# Patient Record
Sex: Male | Born: 1945 | Race: White | Hispanic: No | Marital: Married | Smoking: Never smoker
Health system: Southern US, Community
[De-identification: ages and names within clinical notes are randomized; demographics above are authoritative.]

## PROBLEM LIST (undated history)

## (undated) DIAGNOSIS — N400 Enlarged prostate without lower urinary tract symptoms: Secondary | ICD-10-CM

## (undated) DIAGNOSIS — K219 Gastro-esophageal reflux disease without esophagitis: Secondary | ICD-10-CM

## (undated) DIAGNOSIS — K760 Fatty (change of) liver, not elsewhere classified: Secondary | ICD-10-CM

## (undated) DIAGNOSIS — F259 Schizoaffective disorder, unspecified: Secondary | ICD-10-CM

## (undated) DIAGNOSIS — R7303 Prediabetes: Secondary | ICD-10-CM

## (undated) DIAGNOSIS — K5909 Other constipation: Secondary | ICD-10-CM

## (undated) DIAGNOSIS — E785 Hyperlipidemia, unspecified: Secondary | ICD-10-CM

## (undated) DIAGNOSIS — F419 Anxiety disorder, unspecified: Secondary | ICD-10-CM

## (undated) HISTORY — DX: Hyperlipidemia, unspecified: E78.5

## (undated) HISTORY — DX: Benign prostatic hyperplasia without lower urinary tract symptoms: N40.0

## (undated) HISTORY — DX: Gastro-esophageal reflux disease without esophagitis: K21.9

## (undated) HISTORY — DX: Schizoaffective disorder, unspecified: F25.9

## (undated) HISTORY — PX: OTHER SURGICAL HISTORY: SHX169

## (undated) HISTORY — DX: Fatty (change of) liver, not elsewhere classified: K76.0

## (undated) HISTORY — DX: Prediabetes: R73.03

## (undated) HISTORY — DX: Other constipation: K59.09

## (undated) HISTORY — PX: COLONOSCOPY: SHX174

---

## 2006-11-02 ENCOUNTER — Ambulatory Visit: Payer: Self-pay | Admitting: Gastroenterology

## 2008-07-14 ENCOUNTER — Emergency Department: Payer: Self-pay | Admitting: Emergency Medicine

## 2015-05-01 ENCOUNTER — Other Ambulatory Visit: Payer: Self-pay | Admitting: Primary Care

## 2015-05-01 DIAGNOSIS — I872 Venous insufficiency (chronic) (peripheral): Secondary | ICD-10-CM

## 2015-05-03 ENCOUNTER — Ambulatory Visit
Admission: RE | Admit: 2015-05-03 | Discharge: 2015-05-03 | Disposition: A | Discharge status: Home / Self Care | Payer: Medicare Other | Source: Ambulatory Visit | Attending: Primary Care | Admitting: Primary Care

## 2015-05-03 DIAGNOSIS — I872 Venous insufficiency (chronic) (peripheral): Secondary | ICD-10-CM | POA: Diagnosis present

## 2016-09-19 IMAGING — US US EXTREM LOW VENOUS BILAT
1 series · 13 of 24 positions shown · non-contrast
Comparison: None.

CLINICAL DATA: Chronic pain with venous insufficiency

EXAM:
BILATERAL LOWER EXTREMITY VENOUS DUPLEX ULTRASOUND
TECHNIQUE: Gray-scale sonography with graded compression, as well as color
Doppler and duplex ultrasound were performed to evaluate the lower
extremity deep venous systems from the level of the common femoral
vein and including the common femoral, femoral, profunda femoral,
popliteal and calf veins including the posterior tibial, peroneal
and gastrocnemius veins when visible. The superficial great
saphenous vein was also interrogated. Spectral Doppler was utilized
to evaluate flow at rest and with distal augmentation maneuvers in
the common femoral, femoral and popliteal veins.

[Series 1: us extrem low venous bilat · 0.08mm/px · 13 of 58 slices shown]
[im 1/58]
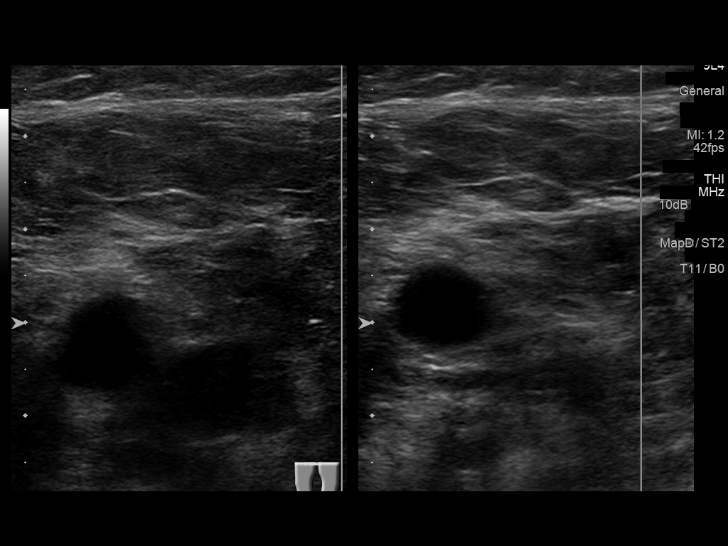
[im 5/58]
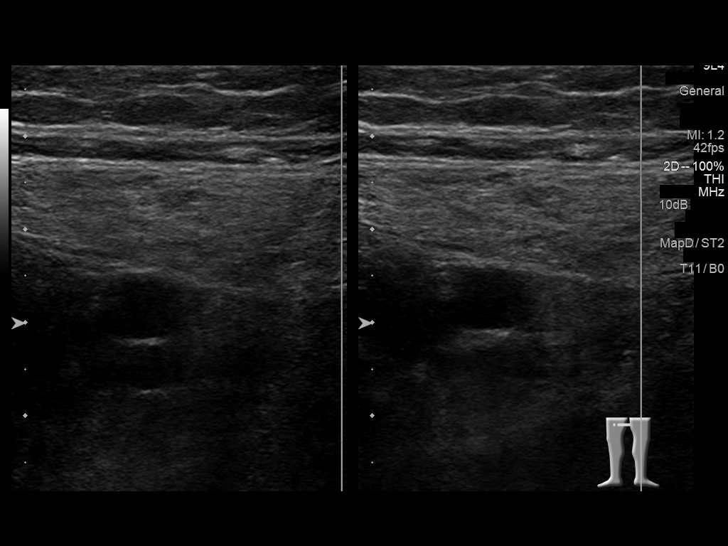
[im 10/58]
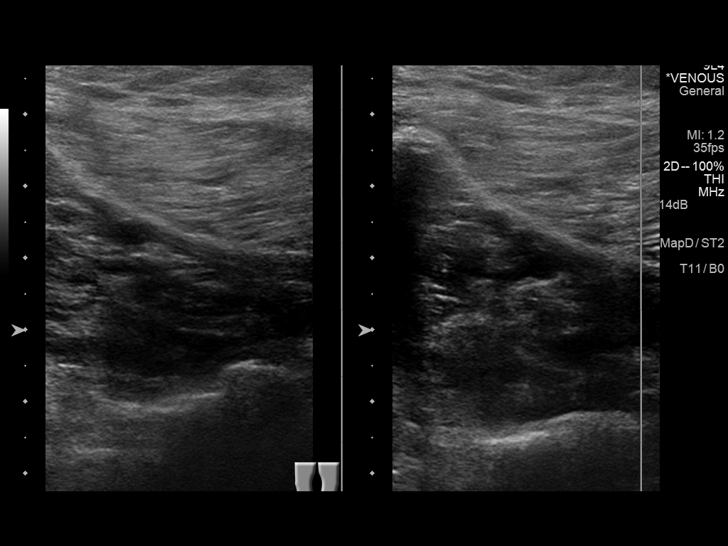
[im 15/58]
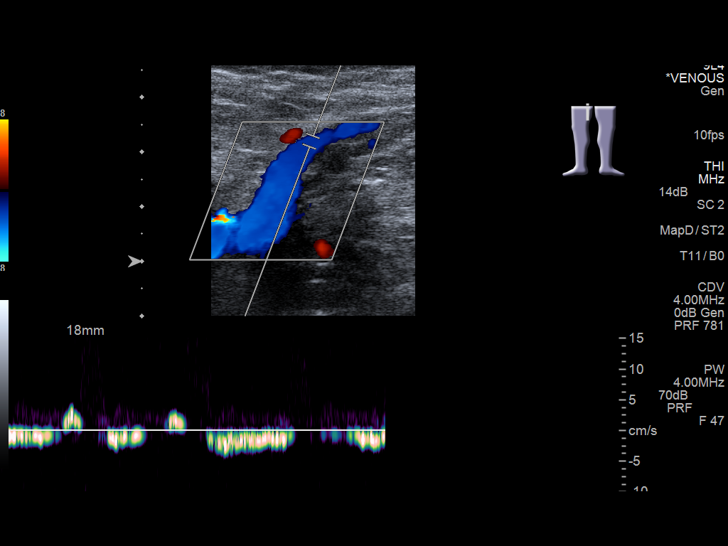
[im 20/58]
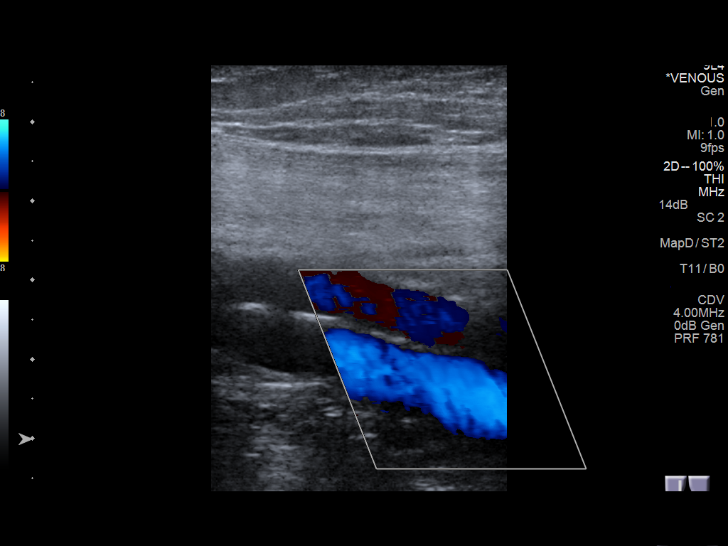
[im 25/58]
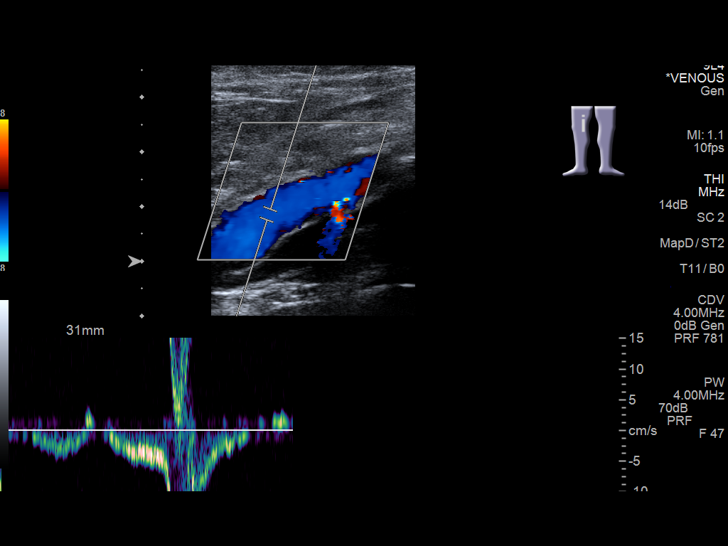
[im 30/58]
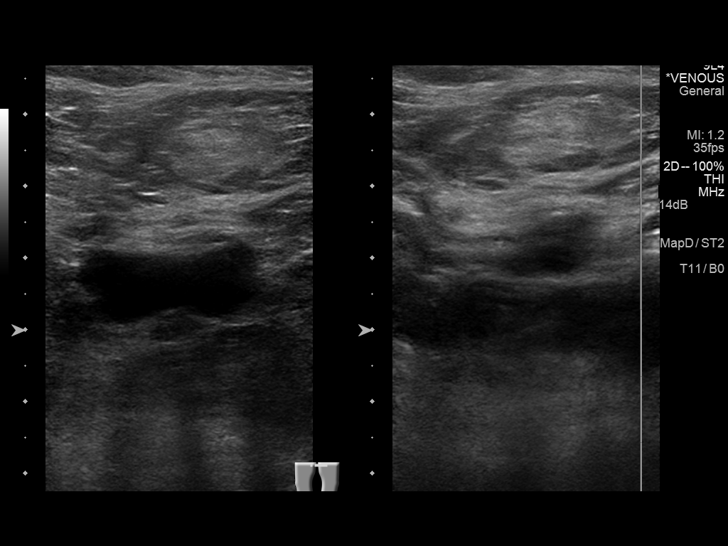
[im 33/58]
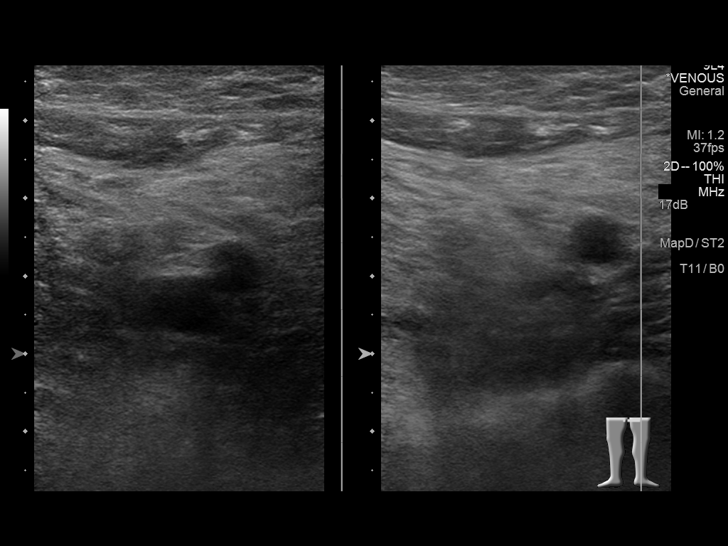
[im 38/58]
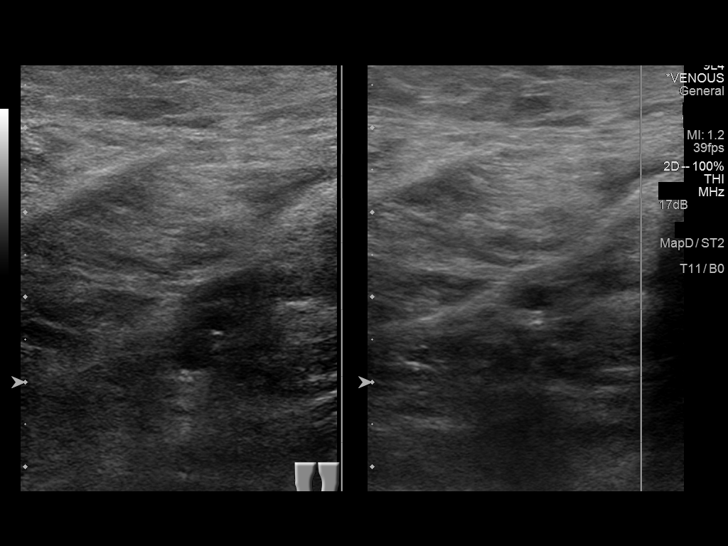
[im 43/58]
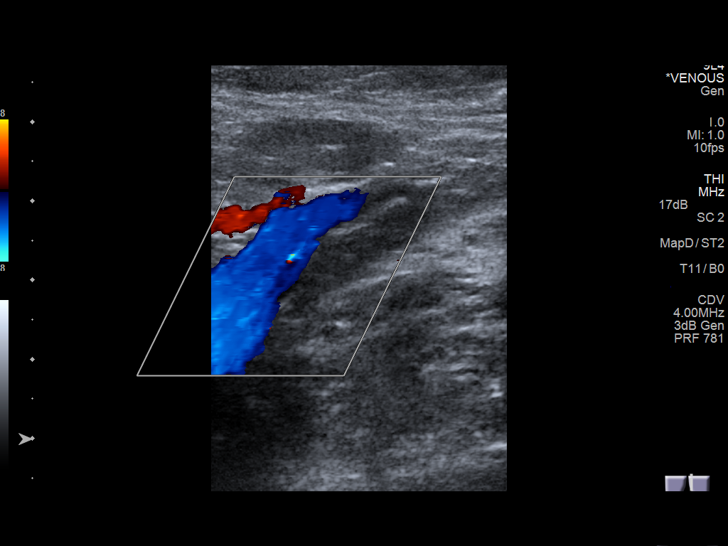
[im 48/58]
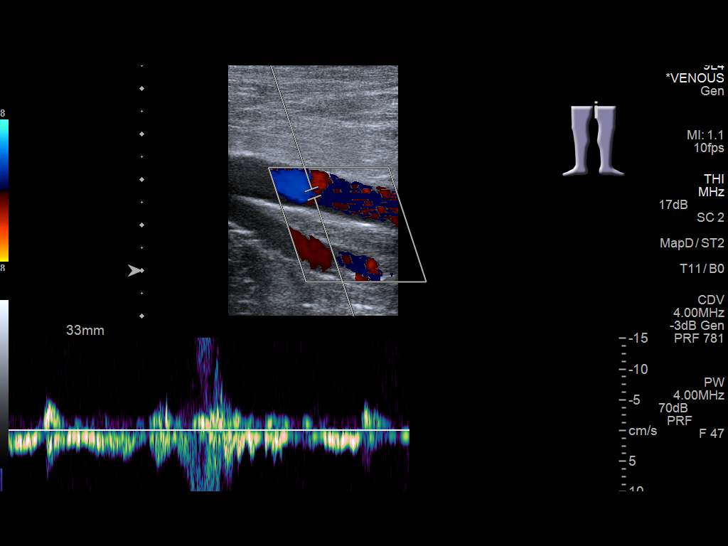
[im 53/58]
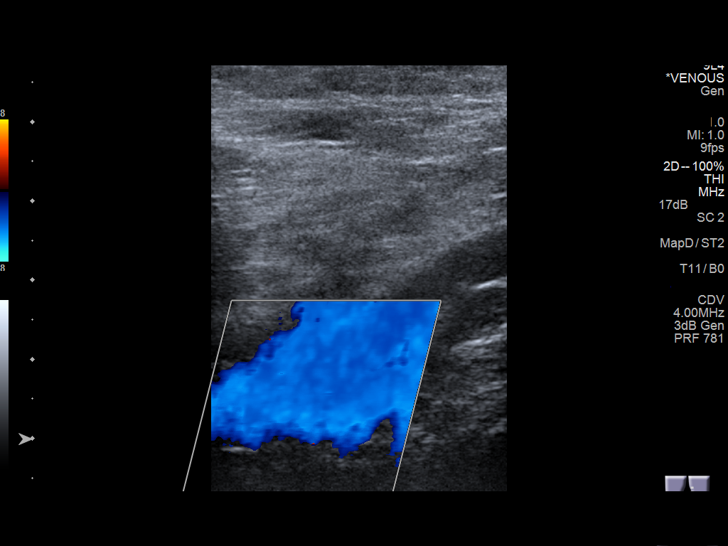
[im 58/58]
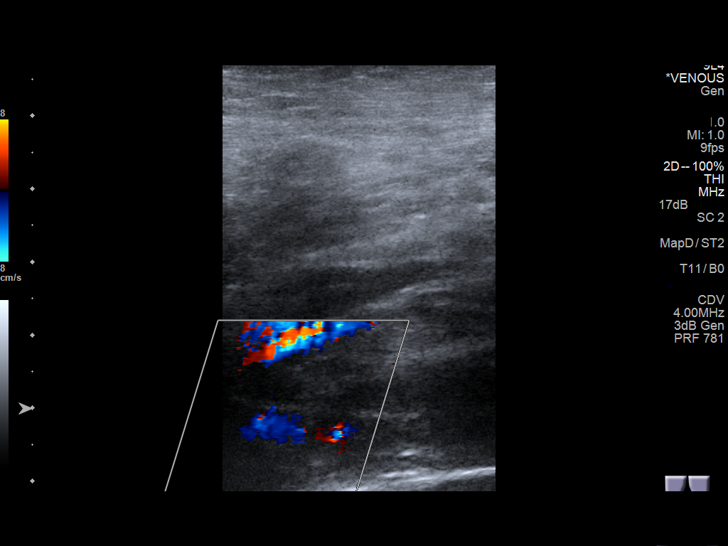

[13 of 24 positions shown; findings below may reference images not displayed]

FINDINGS: RIGHT LOWER EXTREMITY

Common Femoral Vein: No evidence of thrombus. Normal
compressibility, respiratory phasicity and response to augmentation.

Saphenofemoral Junction: No evidence of thrombus. Normal
compressibility and flow on color Doppler imaging.

Profunda Femoral Vein: No evidence of thrombus. Normal
compressibility and flow on color Doppler imaging.

Femoral Vein: No evidence of thrombus. Normal compressibility,
respiratory phasicity and response to augmentation.

Popliteal Vein: No evidence of thrombus. Normal compressibility,
respiratory phasicity and response to augmentation.

Calf Veins: No evidence of thrombus. Normal compressibility and flow
on color Doppler imaging.

Superficial Great Saphenous Vein: No evidence of thrombus. Normal
compressibility and flow on color Doppler imaging.

Venous Reflux:  None.

Other Findings:  None.

LEFT LOWER EXTREMITY

Common Femoral Vein: No evidence of thrombus. Normal
compressibility, respiratory phasicity and response to augmentation.

Saphenofemoral Junction: No evidence of thrombus. Normal
compressibility and flow on color Doppler imaging.

Profunda Femoral Vein: No evidence of thrombus. Normal
compressibility and flow on color Doppler imaging.

Femoral Vein: No evidence of thrombus. Normal compressibility,
respiratory phasicity and response to augmentation.

Popliteal Vein: No evidence of thrombus. Normal compressibility,
respiratory phasicity and response to augmentation.

Calf Veins: No evidence of thrombus. Normal compressibility and flow
on color Doppler imaging.

Superficial Great Saphenous Vein: No evidence of thrombus. Normal
compressibility and flow on color Doppler imaging.

Venous Reflux:  None.

Other Findings:  None.
IMPRESSION: No evidence of deep venous thrombosis in either lower extremity.

## 2017-04-20 ENCOUNTER — Encounter (INDEPENDENT_AMBULATORY_CARE_PROVIDER_SITE_OTHER): Payer: Self-pay

## 2017-04-20 ENCOUNTER — Encounter: Payer: Self-pay | Admitting: Gastroenterology

## 2017-04-20 ENCOUNTER — Ambulatory Visit (INDEPENDENT_AMBULATORY_CARE_PROVIDER_SITE_OTHER): Payer: Medicare Other | Admitting: Gastroenterology

## 2017-04-20 VITALS — BP 112/71 | HR 56 | Temp 97.8°F | Ht 70.0 in | Wt 210.6 lb

## 2017-04-20 DIAGNOSIS — K5904 Chronic idiopathic constipation: Secondary | ICD-10-CM

## 2017-04-20 DIAGNOSIS — D126 Benign neoplasm of colon, unspecified: Secondary | ICD-10-CM | POA: Diagnosis not present

## 2017-04-20 DIAGNOSIS — K219 Gastro-esophageal reflux disease without esophagitis: Secondary | ICD-10-CM

## 2017-04-20 DIAGNOSIS — Z1211 Encounter for screening for malignant neoplasm of colon: Secondary | ICD-10-CM | POA: Diagnosis not present

## 2017-04-20 NOTE — Progress Notes (Signed)
Ryan Darby, MD [address]  [place]  [place], [place] [address]  Main: [phone]  Fax: [phone]    Gastroenterology Consultation  Referring Provider:     [facility] [place] Physician:  [facility] Primary Gastroenterologist:  Dr. Cephas Pearson Reason for Consultation:    Chronic constipation, symptomatic hemorrhoids        HPI:   Ryan Pearson is a 71 y.o. y/o male referred by [facility], [place]  for consultation & management of Chronic constipation And GERD. He has history of schizoaffective disorder, GERD, hyperlipidemia, elevated triglycerides, BPH, IBS He is accompanied by his wife today who takes care of him and provided smoked most of the history. He has been suffering from constipation associated with bloating for several years. He takes MiraLAX as needed which helps him to have a BM every one to 2 days. He does report intermittent loose stools but not with increased frequency. He reports symptomatic hemorrhoids, suffering from rectal bleeding during episodes of constipation, perianal itching and burning sensation. He reports pushing and straining during episodes of severe constipation. He reports intermittent heartburn for which he was prescribed omeprazole, he is not taking currently as it made him constipated. He denies dysphagia, epigastric pain, nausea, vomiting, weight loss He has a flat affect and slow speech secondary to underlying mental health issues according to his wife He is independent of her ADLs and taking care of 79 year old granddaughter  I personally reviewed records from the [place]  GI Procedures:  Colonoscopy 2011 tubular adenomas removed Colonoscopy 06/18/2013 chewable or adenomas without evidence of high-grade dysplasia, recommended repeat colonoscopy in 2017  Past Medical History:  Diagnosis Date  . BPH (benign prostatic hyperplasia)   . Chronic constipation   . Fatty liver disease,  nonalcoholic   . GERD (gastroesophageal reflux disease)   . Hyperlipidemia   . Prediabetes   . Schizoaffective disorder [facility])     Past Surgical History:  Procedure Laterality Date  . none      Prior to Admission medications   Not on File    Family History  Problem Relation Age of Onset  . Diabetes Mother   . Heart disease Mother   . Prostate cancer Father   . Lung cancer Sister   . Leukemia Brother      Social History  Substance Use Topics  . Smoking status: Never Smoker  . Smokeless tobacco: Never Used  . Alcohol use No    Allergies as of 04/20/2017  . (No Known Allergies)    Review of Systems:    All systems reviewed and negative except where noted in HPI.   Physical Exam:  BP 112/71   Pulse (!) 56   Temp 97.8 F (36.6 C) (Oral)   Ht 5\' 10"  (1.778 m)   Wt 210 lb 9.6 oz (95.5 kg)   BMI 30.22 kg/m  No LMP for male patient.  General:   Alert,  Well-developed, well-nourished, pleasant and cooperative in NAD Head:  Normocephalic and atraumatic. Eyes:  Sclera clear, no icterus.   Conjunctiva pink. Ears:  Normal auditory acuity. Nose:  No deformity, discharge, or lesions. Mouth:  No deformity or lesions,oropharynx pink & moist. Neck:  Supple; no masses or thyromegaly. Lungs:  Respirations even and unlabored.  Clear throughout to auscultation.   No wheezes, crackles, or rhonchi. No acute distress. Heart:  Regular rate and rhythm; no murmurs, clicks, rubs, or gallops. Abdomen:  Normal bowel sounds.  Obese, Soft, non-tender and non-distended without masses, hepatosplenomegaly or hernias noted.  No guarding or rebound tenderness.   Rectal: Nor performed Msk:  Symmetrical without gross deformities. Good, equal movement & strength bilaterally. Pulses:  Normal pulses noted. Extremities:  No clubbing or edema.  No cyanosis. Neurologic:  Alert and oriented x3;  grossly normal neurologically. Skin:  Intact without significant lesions or rashes. No jaundice. Lymph  Nodes:  No significant cervical adenopathy. Psych:  Alert and cooperative. Normal mood and affect.  Imaging Studies: Barium swallow 12/03/2016 Moderate esophageal dysmotility Spontaneous and provoked reflux to the level of the midesophagus No hiatal hernia Ultrasound abdomen limited 11/21/2016 Sludge in the gallbladder with no evidence of acute cholecystitis Diffuse hepatic steatosis  Labs LFTs normal on 11/21/2016 Hemoglobin A1c 6.4 Triglycerides 173 Creatinine 1.2  Assessment and Plan:   Ryan Pearson is a 71 y.o. male with Obesity, hyperlipidemia, schizoaffective disorder, chronic GERD and chronic idiopathic constipation.  Chronic idiopathic constipation: Recommend MiraLAX one to 2 times daily Discussed about high-fiber diet Advised trial of linaclotide but patient preferred to try MiraLAX first Check TSH levels  History of tubular adenomas of colon [facility], he is overdue Schedule colonoscopy for colon cancer surveillance  Chronic GERD Never had an EGD before, recommend one given history of obesity and chronic GERD He doesn't have regular heartburn  Fatty liver Based on ultrasound at the [place] LFTs are normal from 10/2016 Recheck LFTs Discussed about weight loss and lifestyle changes to prevent progression of fatty liver  I have discussed alternative options, risks & benefits,  which include, but are not limited to, bleeding, infection, perforation,respiratory complication & drug reaction.  The patient agrees with this plan & written consent will be obtained.    Follow up in 4 weeks   Ryan Darby, MD

## 2017-05-07 ENCOUNTER — Encounter
Admission: RE | Disposition: A | Discharge status: Home / Self Care | Payer: Self-pay | Source: Ambulatory Visit | Attending: Gastroenterology

## 2017-05-07 ENCOUNTER — Ambulatory Visit
Admission: RE | Admit: 2017-05-07 | Discharge: 2017-05-07 | Disposition: A | Discharge status: Home / Self Care | Payer: Non-veteran care | Source: Ambulatory Visit | Attending: Gastroenterology | Admitting: Gastroenterology

## 2017-05-07 ENCOUNTER — Ambulatory Visit: Payer: Non-veteran care | Admitting: Anesthesiology

## 2017-05-07 DIAGNOSIS — Z1211 Encounter for screening for malignant neoplasm of colon: Secondary | ICD-10-CM

## 2017-05-07 DIAGNOSIS — K5900 Constipation, unspecified: Secondary | ICD-10-CM | POA: Diagnosis not present

## 2017-05-07 DIAGNOSIS — D124 Benign neoplasm of descending colon: Secondary | ICD-10-CM | POA: Insufficient documentation

## 2017-05-07 DIAGNOSIS — F209 Schizophrenia, unspecified: Secondary | ICD-10-CM | POA: Insufficient documentation

## 2017-05-07 DIAGNOSIS — K259 Gastric ulcer, unspecified as acute or chronic, without hemorrhage or perforation: Secondary | ICD-10-CM | POA: Insufficient documentation

## 2017-05-07 DIAGNOSIS — E785 Hyperlipidemia, unspecified: Secondary | ICD-10-CM | POA: Insufficient documentation

## 2017-05-07 DIAGNOSIS — Z7982 Long term (current) use of aspirin: Secondary | ICD-10-CM | POA: Insufficient documentation

## 2017-05-07 DIAGNOSIS — K760 Fatty (change of) liver, not elsewhere classified: Secondary | ICD-10-CM | POA: Diagnosis not present

## 2017-05-07 DIAGNOSIS — Z79899 Other long term (current) drug therapy: Secondary | ICD-10-CM | POA: Diagnosis not present

## 2017-05-07 DIAGNOSIS — D120 Benign neoplasm of cecum: Secondary | ICD-10-CM | POA: Insufficient documentation

## 2017-05-07 DIAGNOSIS — K641 Second degree hemorrhoids: Secondary | ICD-10-CM | POA: Diagnosis not present

## 2017-05-07 DIAGNOSIS — K2950 Unspecified chronic gastritis without bleeding: Secondary | ICD-10-CM | POA: Insufficient documentation

## 2017-05-07 DIAGNOSIS — G4730 Sleep apnea, unspecified: Secondary | ICD-10-CM | POA: Diagnosis not present

## 2017-05-07 DIAGNOSIS — I10 Essential (primary) hypertension: Secondary | ICD-10-CM | POA: Diagnosis not present

## 2017-05-07 DIAGNOSIS — Z8719 Personal history of other diseases of the digestive system: Secondary | ICD-10-CM | POA: Insufficient documentation

## 2017-05-07 DIAGNOSIS — N400 Enlarged prostate without lower urinary tract symptoms: Secondary | ICD-10-CM | POA: Insufficient documentation

## 2017-05-07 DIAGNOSIS — F419 Anxiety disorder, unspecified: Secondary | ICD-10-CM | POA: Insufficient documentation

## 2017-05-07 DIAGNOSIS — K219 Gastro-esophageal reflux disease without esophagitis: Secondary | ICD-10-CM | POA: Insufficient documentation

## 2017-05-07 HISTORY — PX: ESOPHAGOGASTRODUODENOSCOPY (EGD) WITH PROPOFOL: SHX5813

## 2017-05-07 HISTORY — PX: COLONOSCOPY WITH PROPOFOL: SHX5780

## 2017-05-07 HISTORY — DX: Anxiety disorder, unspecified: F41.9

## 2017-05-07 LAB — HEPATIC FUNCTION PANEL
ALK PHOS: 42 U/L (ref 38–126)
ALT: 33 U/L (ref 17–63)
AST: 46 U/L — AB (ref 15–41)
Albumin: 3.6 g/dL (ref 3.5–5.0)
BILIRUBIN DIRECT: 0.2 mg/dL (ref 0.1–0.5)
BILIRUBIN INDIRECT: 0.7 mg/dL (ref 0.3–0.9)
Total Bilirubin: 0.9 mg/dL (ref 0.3–1.2)
Total Protein: 6.4 g/dL — ABNORMAL LOW (ref 6.5–8.1)

## 2017-05-07 LAB — TSH: TSH: 1.567 u[IU]/mL (ref 0.350–4.500)

## 2017-05-07 LAB — BASIC METABOLIC PANEL
ANION GAP: 11 (ref 5–15)
BUN: 8 mg/dL (ref 6–20)
CALCIUM: 9 mg/dL (ref 8.9–10.3)
CO2: 24 mmol/L (ref 22–32)
Chloride: 104 mmol/L (ref 101–111)
Creatinine, Ser: 1.36 mg/dL — ABNORMAL HIGH (ref 0.61–1.24)
GFR calc Af Amer: 60 mL/min — ABNORMAL LOW (ref >60)
GFR, EST NON AFRICAN AMERICAN: 52 mL/min — AB (ref >60)
Glucose, Bld: 161 mg/dL — ABNORMAL HIGH (ref 65–99)
POTASSIUM: 3.3 mmol/L — AB (ref 3.5–5.1)
Sodium: 139 mmol/L (ref 135–145)

## 2017-05-07 LAB — CBC
HCT: 38 % — ABNORMAL LOW (ref 40.0–52.0)
HEMOGLOBIN: 12.3 g/dL — AB (ref 13.0–18.0)
MCH: 29.9 pg (ref 26.0–34.0)
MCHC: 32.4 g/dL (ref 32.0–36.0)
MCV: 92.3 fL (ref 80.0–100.0)
Platelets: 100 10*3/uL — ABNORMAL LOW (ref 150–440)
RBC: 4.12 MIL/uL — AB (ref 4.40–5.90)
RDW: 14.2 % (ref 11.5–14.5)
WBC: 4.6 10*3/uL (ref 3.8–10.6)

## 2017-05-07 SURGERY — COLONOSCOPY WITH PROPOFOL
Anesthesia: General

## 2017-05-07 MED ORDER — MIDAZOLAM HCL 2 MG/2ML IJ SOLN
INTRAMUSCULAR | Status: AC
  Filled 2017-05-07: qty 2

## 2017-05-07 MED ORDER — FENTANYL CITRATE (PF) 100 MCG/2ML IJ SOLN
INTRAMUSCULAR | Status: DC | PRN
Start: 2017-05-07 — End: 2017-05-07
  Administered 2017-05-07: 25 ug via INTRAVENOUS
  Administered 2017-05-07: 50 ug via INTRAVENOUS
  Administered 2017-05-07: 25 ug via INTRAVENOUS

## 2017-05-07 MED ORDER — PROPOFOL 500 MG/50ML IV EMUL
INTRAVENOUS | Status: DC | PRN
  Administered 2017-05-07: 120 ug/kg/min via INTRAVENOUS

## 2017-05-07 MED ORDER — PROPOFOL 500 MG/50ML IV EMUL
INTRAVENOUS | Status: AC
  Filled 2017-05-07: qty 50

## 2017-05-07 MED ORDER — PROPOFOL 10 MG/ML IV BOLUS
INTRAVENOUS | Status: AC
  Filled 2017-05-07: qty 20

## 2017-05-07 MED ORDER — SODIUM CHLORIDE 0.9 % IV SOLN
INTRAVENOUS | Status: DC
  Administered 2017-05-07: 09:00:00 via INTRAVENOUS

## 2017-05-07 MED ORDER — FLEET ENEMA 7-19 GM/118ML RE ENEM
1.0000 | ENEMA | Freq: Once | RECTAL | Status: AC
  Administered 2017-05-07: 1 via RECTAL

## 2017-05-07 MED ORDER — EPHEDRINE SULFATE 50 MG/ML IJ SOLN
INTRAMUSCULAR | Status: DC | PRN
  Administered 2017-05-07: 10 mg via INTRAVENOUS

## 2017-05-07 MED ORDER — LIDOCAINE HCL (CARDIAC) 20 MG/ML IV SOLN
INTRAVENOUS | Status: DC | PRN
  Administered 2017-05-07: 30 mg via INTRAVENOUS

## 2017-05-07 MED ORDER — EPHEDRINE SULFATE 50 MG/ML IJ SOLN
INTRAMUSCULAR | Status: AC
  Filled 2017-05-07: qty 1

## 2017-05-07 MED ORDER — FENTANYL CITRATE (PF) 100 MCG/2ML IJ SOLN
INTRAMUSCULAR | Status: AC
  Filled 2017-05-07: qty 2

## 2017-05-07 MED ORDER — MIDAZOLAM HCL 2 MG/2ML IJ SOLN
INTRAMUSCULAR | Status: DC | PRN
  Administered 2017-05-07: 2 mg via INTRAVENOUS

## 2017-05-07 MED ORDER — LIDOCAINE HCL (PF) 2 % IJ SOLN
INTRAMUSCULAR | Status: AC
  Filled 2017-05-07: qty 10

## 2017-05-07 NOTE — H&P (Signed)
Cephas Darby, MD [address]  [place]  [place], [place] [address]  Main: [phone]  Fax: [phone] Pager: [phone]  Primary Care Physician:  [facility], [facility] Primary Gastroenterologist:  Dr. Cephas Darby  Pre-Procedure History & Physical: HPI:  Ryan Pearson is a 71 y.o. male is here for an endoscopy and colonoscopy.   Past Medical History:  Diagnosis Date  . Anxiety   . BPH (benign prostatic hyperplasia)   . Chronic constipation   . Fatty liver disease, nonalcoholic   . GERD (gastroesophageal reflux disease)   . Hyperlipidemia   . Prediabetes   . Schizoaffective disorder [facility])     Past Surgical History:  Procedure Laterality Date  . COLONOSCOPY    . none      Prior to Admission medications   Medication Sig Start Date End Date Taking? Authorizing Provider  alfuzosin (UROXATRAL) 10 MG 24 hr tablet Take 10 mg by mouth daily with breakfast.    [provider]  aspirin 81 MG chewable tablet Chew by mouth daily.    [provider]  Calcium Carbonate-Vitamin D3 (CALCIUM 600/VITAMIN D) 600-400 MG-UNIT TABS Take by mouth.    [provider]  carbamazepine (TEGRETOL) 200 MG tablet Take 200 mg by mouth 3 (three) times daily.    [provider]  chlorhexidine (PERIDEX) 0.12 % solution Use as directed 15 mLs in the mouth or throat 2 (two) times daily.    [provider]  Cholecalciferol (VITAMIN D3) 1000 units CAPS Take by mouth.    [provider]  coal tar-salicylic acid 2 % shampoo Apply topically daily as needed for itching.    [provider]  fluticasone (FLONASE) 50 MCG/ACT nasal spray Place into both nostrils daily.    [provider]  Lactobacillus Acidophilus POWD by Does not apply route.    [provider]  loratadine (CLARITIN) 10 MG tablet Take 10 mg by mouth daily as needed for allergies.    [provider]  Omega-3 Fatty Acids (FISH OIL) 1000 MG CAPS  Take 2 capsules by mouth 2 (two) times daily.    [provider]  oxybutynin (DITROPAN) 5 MG tablet Take 5 mg by mouth 3 (three) times daily.    [provider]  Polyethylene Glycol 3350-GRX POWD Take by mouth.    [provider]  propranolol (INDERAL) 10 MG tablet Take 10 mg by mouth 2 (two) times daily.    [provider]  psyllium (METAMUCIL) 58.6 % packet Take 1 packet by mouth daily.    [provider]  risperiDONE (RISPERDAL M-TABS) 4 MG disintegrating tablet Take 4 mg by mouth daily.    [provider]  sertraline (ZOLOFT) 25 MG tablet Take 25 mg by mouth daily.    [provider]    Allergies as of 04/20/2017  . (No Known Allergies)    Family History  Problem Relation Age of Onset  . Diabetes Mother   . Heart disease Mother   . Prostate cancer Father   . Lung cancer Sister   . Leukemia Brother     Social History   Socioeconomic History  . Marital status: Married    Spouse name: Not on file  . Number of children: Not on file  . Years of education: Not on file  . Highest education level: Not on file  Social Needs  . Financial resource strain: Not on file  . Food insecurity - worry: Not on file  .  Food insecurity - inability: Not on file  . Transportation needs - medical: Not on file  . Transportation needs - non-medical: Not on file  Occupational History  . Not on file  Tobacco Use  . Smoking status: Never Smoker  . Smokeless tobacco: Never Used  Substance and Sexual Activity  . Alcohol use: No  . Drug use: No  . Sexual activity: Not on file  Other Topics Concern  . Not on file  Social History Narrative  . Not on file    Review of Systems: See HPI, otherwise negative ROS  Physical Exam: There were no vitals taken for this visit. General:   Alert,  pleasant and cooperative in NAD Head:  Normocephalic and atraumatic. Neck:  Supple; no masses or thyromegaly. Lungs:  Clear throughout to  auscultation.    Heart:  Regular rate and rhythm. Abdomen:  Soft, nontender and nondistended. Normal bowel sounds, without guarding, and without rebound.   Neurologic:  Alert and  oriented x4;  grossly normal neurologically.  Impression/Plan: Ryan Pearson is here for an endoscopy and colonoscopy to be performed for chronic GERD and surveillance colonoscopy  Risks, benefits, limitations, and alternatives regarding  endoscopy and colonoscopy have been reviewed with the patient.  Questions have been answered.  All parties agreeable.   Sherri Sear, MD  05/07/2017, 8:59 AM

## 2017-05-07 NOTE — Anesthesia Postprocedure Evaluation (Signed)
Anesthesia Post Note  Patient: Ryan Pearson  Procedure(s) Performed: COLONOSCOPY WITH PROPOFOL (N/A ) ESOPHAGOGASTRODUODENOSCOPY (EGD) WITH PROPOFOL (N/A )  Patient location during evaluation: Endoscopy Anesthesia Type: General Level of consciousness: awake and alert Pain management: pain level controlled Vital Signs Assessment: post-procedure vital signs reviewed and stable Respiratory status: spontaneous breathing and respiratory function stable Cardiovascular status: stable Anesthetic complications: no     Last Vitals:  Vitals:   05/07/17 1029 05/07/17 1039  BP: 110/65 108/68  Pulse: 78 65  Resp: 11 10  Temp:    SpO2: 97% 98%    Last Pain:  Vitals:   05/07/17 1029  TempSrc: Tympanic                 Sicilia Killough K

## 2017-05-07 NOTE — Anesthesia Procedure Notes (Signed)
Performed by: Vaughan Sine Pre-anesthesia Checklist: Patient identified, Emergency Drugs available, Suction available, Timeout performed and Patient being monitored Patient Re-evaluated:Patient Re-evaluated prior to induction Oxygen Delivery Method: Nasal cannula Preoxygenation: Pre-oxygenation with 100% oxygen Induction Type: IV induction Airway Equipment and Method: Bite block Placement Confirmation: positive ETCO2 and CO2 detector

## 2017-05-07 NOTE — Op Note (Signed)
[facility] Patient Name: Ryan Pearson Procedure Date: 05/07/2017 9:23 AM MRN: [id number] Account #: [id number] Date of Birth: [birth year] Admit Type: Outpatient Age: 71 Room: [facility] ENDO ROOM 3 Gender: Male Note Status: Finalized Procedure:            Upper GI endoscopy Indications:          Follow-up of gastro-esophageal reflux disease Providers:            Lin Landsman MD, MD Referring MD:         [facility], MD (Referring MD) Medicines:            Monitored Anesthesia Care Complications:        No immediate complications. Estimated blood loss:                        Minimal. Procedure:            Pre-Anesthesia Assessment:                       - Prior to the procedure, a History and Physical was                        performed, and patient medications and allergies were                        reviewed. The patient is competent. The risks and                        benefits of the procedure and the sedation options and                        risks were discussed with the patient. All questions                        were answered and informed consent was obtained.                        Patient identification and proposed procedure were                        verified by the physician, the nurse, the                        anesthesiologist, the anesthetist and the technician in                        the pre-procedure area in the procedure room. Mental                        Status Examination: alert and oriented. Airway                        Examination: normal oropharyngeal airway and neck                        mobility. Respiratory Examination: clear to                        auscultation. CV Examination: normal. Prophylactic  Antibiotics: The patient does not require prophylactic                        antibiotics. Prior Anticoagulants: The patient has                        taken aspirin, last  dose was 5 days prior to procedure.                        ASA Grade Assessment: III - A patient with severe                        systemic disease. After reviewing the risks and                        benefits, the patient was deemed in satisfactory                        condition to undergo the procedure. The anesthesia plan                        was to use monitored anesthesia care (MAC). Immediately                        prior to administration of medications, the patient was                        re-assessed for adequacy to receive sedatives. The                        heart rate, respiratory rate, oxygen saturations, blood                        pressure, adequacy of pulmonary ventilation, and                        response to care were monitored throughout the                        procedure. The physical status of the patient was                        re-assessed after the procedure.                       After obtaining informed consent, the endoscope was                        passed under direct vision. Throughout the procedure,                        the patient's blood pressure, pulse, and oxygen                        saturations were monitored continuously. The Endoscope                        was introduced through the mouth, and advanced to the  second part of duodenum. The patient tolerated the                        procedure well. The upper GI endoscopy was accomplished                        without difficulty. The patient tolerated the procedure                        well. Findings:      The duodenal bulb and second portion of the duodenum were normal.      Multiple dispersed, small non-bleeding erosions were found in the       gastric fundus, in the gastric body, at the incisura and in the gastric       antrum. There were stigmata of recent bleeding. Biopsies were taken with       a cold forceps for Helicobacter pylori testing.       Esophagogastric landmarks were identified: the gastroesophageal junction       was found at 40 cm from the incisors.      The gastroesophageal junction and examined esophagus were normal. Impression:           - Normal duodenal bulb and second portion of the                        duodenum.                       - Non-bleeding erosive gastropathy. Biopsied.                       - Esophagogastric landmarks identified.                       - Normal gastroesophageal junction and esophagus. Recommendation:       - Await pathology results and treat H Pylori if                        positive.                       - Continue present medications.                       - Proceed with colonoscopy today as scheduled Procedure Code(s):    --- Professional ---                       [phone], Esophagogastroduodenoscopy, flexible, transoral;                        with biopsy, single or multiple Diagnosis Code(s):    --- Professional ---                       K31.89, Other diseases of stomach and duodenum                       K21.9, Gastro-esophageal reflux disease without                        esophagitis CPT copyright 2016 American Medical Association. All rights reserved. The codes documented in this report are preliminary  and upon coder review may  be revised to meet current compliance requirements. Dr. Ulyess Mort Lin Landsman MD, MD 05/07/2017 10:37:56 AM This report has been signed electronically. Number of Addenda: 0 Note Initiated On: 05/07/2017 9:23 AM      [facility]

## 2017-05-07 NOTE — Anesthesia Preprocedure Evaluation (Signed)
Anesthesia Evaluation  Patient identified by MRN, date of birth, ID band Patient awake    Reviewed: Allergy & Precautions, NPO status , Patient's Chart, lab work & pertinent test results  History of Anesthesia Complications Negative for: history of anesthetic complications  Airway Mallampati: III       Dental   Pulmonary neg sleep apnea, neg COPD,           Cardiovascular hypertension, Pt. on home beta blockers (-) Past MI and (-) CHF (-) dysrhythmias (-) Valvular Problems/Murmurs     Neuro/Psych Seizures - (one seizure 30 yrs ago, no meds, no problems since),  Anxiety Schizophrenia    GI/Hepatic Neg liver ROS, GERD  ,  Endo/Other  neg diabetes  Renal/GU negative Renal ROS     Musculoskeletal   Abdominal   Peds  Hematology   Anesthesia Other Findings   Reproductive/Obstetrics                             Anesthesia Physical Anesthesia Plan  ASA: III  Anesthesia Plan: General   Post-op Pain Management:    Induction: Intravenous  PONV Risk Score and Plan: Propofol infusion  Airway Management Planned: Nasal Cannula  Additional Equipment:   Intra-op Plan:   Post-operative Plan:   Informed Consent: I have reviewed the patients History and Physical, chart, labs and discussed the procedure including the risks, benefits and alternatives for the proposed anesthesia with the patient or authorized representative who has indicated his/her understanding and acceptance.     Plan Discussed with:   Anesthesia Plan Comments:         Anesthesia Quick Evaluation

## 2017-05-07 NOTE — Anesthesia Post-op Follow-up Note (Signed)
Anesthesia QCDR form completed.        

## 2017-05-07 NOTE — Op Note (Signed)
[facility] Gastroenterology Patient Name: Ryan Pearson Procedure Date: 05/07/2017 9:23 AM MRN: [id number] Account #: [id number] Date of Birth: Feb 10, 1946 Admit Type: Outpatient Age: 71 Room: [facility] ENDO ROOM 3 Gender: Male Note Status: Finalized Procedure:            Colonoscopy Indications:          High risk colon cancer surveillance: Personal history                        of colonic polyps Providers:            Lin Landsman MD, MD Referring MD:         [facility], MD (Referring MD) Medicines:            Monitored Anesthesia Care Complications:        No immediate complications. Estimated blood loss:                        Minimal. Procedure:            Pre-Anesthesia Assessment:                       - Prior to the procedure, a History and Physical was                        performed, and patient medications and allergies were                        reviewed. The patient is competent. The risks and                        benefits of the procedure and the sedation options and                        risks were discussed with the patient. All questions                        were answered and informed consent was obtained.                        Patient identification and proposed procedure were                        verified by the physician, the nurse, the                        anesthesiologist, the anesthetist and the technician in                        the pre-procedure area in the procedure room. Mental                        Status Examination: alert and oriented. Airway                        Examination: normal oropharyngeal airway and neck                        mobility. Respiratory Examination: clear to  auscultation. CV Examination: normal. Prophylactic                        Antibiotics: The patient does not require prophylactic                        antibiotics. Prior Anticoagulants: The patient has                      taken aspirin, last dose was 5 days prior to procedure.                        ASA Grade Assessment: III - A patient with severe                        systemic disease. After reviewing the risks and                        benefits, the patient was deemed in satisfactory                        condition to undergo the procedure. The anesthesia plan                        was to use monitored anesthesia care (MAC). Immediately                        prior to administration of medications, the patient was                        re-assessed for adequacy to receive sedatives. The                        heart rate, respiratory rate, oxygen saturations, blood                        pressure, adequacy of pulmonary ventilation, and                        response to care were monitored throughout the                        procedure. The physical status of the patient was                        re-assessed after the procedure.                       After obtaining informed consent, the colonoscope was                        passed under direct vision. Throughout the procedure,                        the patient's blood pressure, pulse, and oxygen                        saturations were monitored continuously. The                        Colonoscope was introduced  through the anus and                        advanced to the the cecum, identified by appendiceal                        orifice and ileocecal valve. The colonoscopy was                        technically difficult and complex due to significant                        looping and a tortuous colon. Successful completion of                        the procedure was aided by using manual pressure. The                        patient tolerated the procedure well. The quality of                        the bowel preparation was evaluated using the BBPS                        [facility] Bowel Preparation Scale) with scores of: Right                         Colon = 3, Transverse Colon = 3 and Left Colon = 3                        (entire mucosa seen well with no residual staining,                        small fragments of stool or opaque liquid). The total                        BBPS score equals 9. Findings:      The perianal exam findings include non-thrombosed internal hemorrhoids       and internal hemorrhoids that prolapse with straining, but spontaneously       regress to the resting position (Grade II).      A 8 mm polyp was found in the cecum. The polyp was sessile. The polyp       was removed with a hot snare. Resection and retrieval were complete.      A 7 mm polyp was found in the descending colon. The polyp was sessile.       The polyp was removed with a hot snare. Resection and retrieval were       complete.      Non-bleeding internal hemorrhoids were found during endoscopy. The       hemorrhoids were large.      - Unable to perform retroflexion Impression:           - Non-thrombosed internal hemorrhoids and internal                        hemorrhoids that prolapse with straining, but  spontaneously regress to the resting position (Grade                        II) found on perianal exam.                       - One 8 mm polyp in the cecum, removed with a hot                        snare. Resected and retrieved.                       - One 7 mm polyp in the descending colon, removed with                        a hot snare. Resected and retrieved.                       - Non-bleeding internal hemorrhoids. Recommendation:       - Discharge patient to home.                       - Resume previous diet today.                       - Continue present medications.                       - Await pathology results.                       - Repeat colonoscopy in 5 years for surveillance based                        on pathology results.                       - Return to my office at appointment  to be scheduled.                       - Recommend ligation of internal hemorrhoids as out pt Procedure Code(s):    --- Professional ---                       [phone], Colonoscopy, flexible; with removal of tumor(s),                        polyp(s), or other lesion(s) by snare technique Diagnosis Code(s):    --- Professional ---                       Z86.010, Personal history of colonic polyps                       D12.0, Benign neoplasm of cecum                       D12.4, Benign neoplasm of descending colon                       K64.1, Second degree hemorrhoids CPT copyright 2016 American Medical Association. All rights reserved. The codes documented in this report are preliminary and upon coder  review may  be revised to meet current compliance requirements. Dr. Ulyess Mort Lin Landsman MD, MD 05/07/2017 10:45:12 AM This report has been signed electronically. Number of Addenda: 0 Note Initiated On: 05/07/2017 9:23 AM Scope Withdrawal Time: 0 hours 25 minutes 57 seconds  Total Procedure Duration: 0 hours 34 minutes 41 seconds       [facility]

## 2017-05-07 NOTE — Transfer of Care (Signed)
Immediate Anesthesia Transfer of Care Note  Patient: Ryan Pearson  Procedure(s) Performed: COLONOSCOPY WITH PROPOFOL (N/A ) ESOPHAGOGASTRODUODENOSCOPY (EGD) WITH PROPOFOL (N/A )  Patient Location: PACU  Anesthesia Type:General  Level of Consciousness: awake and sedated  Airway & Oxygen Therapy: Patient Spontanous Breathing and Patient connected to nasal cannula oxygen  Post-op Assessment: Report given to RN and Post -op Vital signs reviewed and stable  Post vital signs: Reviewed and stable  Last Vitals:  Vitals:   05/07/17 0850  BP: 128/65  Pulse: (!) 54  Resp: 20  Temp: (!) 35.6 C  SpO2: 100%    Last Pain:  Vitals:   05/07/17 0850  TempSrc: Oral         Complications: No apparent anesthesia complications

## 2017-05-08 ENCOUNTER — Other Ambulatory Visit: Payer: Self-pay

## 2017-05-08 ENCOUNTER — Encounter: Payer: Self-pay | Admitting: Gastroenterology

## 2017-05-08 DIAGNOSIS — K760 Fatty (change of) liver, not elsewhere classified: Secondary | ICD-10-CM

## 2017-05-08 LAB — SURGICAL PATHOLOGY

## 2017-05-19 ENCOUNTER — Other Ambulatory Visit: Payer: Self-pay

## 2017-05-19 ENCOUNTER — Telehealth: Payer: Self-pay

## 2017-05-19 DIAGNOSIS — K7460 Unspecified cirrhosis of liver: Secondary | ICD-10-CM

## 2017-05-19 NOTE — Telephone Encounter (Signed)
Ryan Pearson wife said labs were done at bedside during colonoscopy.  I've ordered Mitochondrial antibodies to be done before [name] visit with [place].  Thanks [employer]

## 2017-09-15 ENCOUNTER — Telehealth: Payer: Self-pay | Admitting: Gastroenterology

## 2017-09-15 NOTE — Telephone Encounter (Signed)
Please call patient's wife as she has question about the hemorrhoid banding clinic.

## 2017-09-18 ENCOUNTER — Encounter (INDEPENDENT_AMBULATORY_CARE_PROVIDER_SITE_OTHER): Payer: Self-pay

## 2017-09-18 ENCOUNTER — Ambulatory Visit (INDEPENDENT_AMBULATORY_CARE_PROVIDER_SITE_OTHER): Payer: Medicare Other | Admitting: Gastroenterology

## 2017-09-18 ENCOUNTER — Encounter: Payer: Self-pay | Admitting: Gastroenterology

## 2017-09-18 VITALS — BP 113/74 | HR 51 | Temp 97.6°F | Ht 70.0 in | Wt 206.8 lb

## 2017-09-18 DIAGNOSIS — K648 Other hemorrhoids: Secondary | ICD-10-CM

## 2017-09-18 DIAGNOSIS — K640 First degree hemorrhoids: Secondary | ICD-10-CM

## 2017-09-18 NOTE — Progress Notes (Signed)
Cephas Darby, MD [address]  [place]  [place], [place] [address]  Main: [phone]  Fax: [phone]    Gastroenterology Consultation  Referring Provider:     [facility] [place] Physician:  [facility] Primary Gastroenterologist:  Dr. Cephas Darby Reason for Consultation:    Chronic constipation, symptomatic hemorrhoids        HPI:   Ryan Pearson is a 72 y.o. y/o male referred by [facility], [place]  for consultation & management of Chronic constipation And GERD. He has history of schizoaffective disorder, GERD, hyperlipidemia, elevated triglycerides, BPH, IBS He is accompanied by his wife today who takes care of him and provided smoked most of the history. He has been suffering from constipation associated with bloating for several years. He takes MiraLAX as needed which helps him to have a BM every one to 2 days. He does report intermittent loose stools but not with increased frequency. He reports symptomatic hemorrhoids, suffering from rectal bleeding during episodes of constipation, perianal itching and burning sensation. He reports pushing and straining during episodes of severe constipation. He reports intermittent heartburn for which he was prescribed omeprazole, he is not taking currently as it made him constipated. He denies dysphagia, epigastric pain, nausea, vomiting, weight loss He has a flat affect and slow speech secondary to underlying mental health issues according to his wife He is independent of her ADLs and taking care of 10 year old granddaughter  I personally reviewed records from the [place]  Follow-up visit 09/18/2017: He underwent EGD and colonoscopy. He continues to drink diet sodas, eats red meat regularly, also uses artificial sweeteners. He is taking MiraLAX once daily along with Metamucil. He is accompanied by his wife today who reports that they have been eating more vegetables and fruits. He continues to  have episodes of nonbloody diarrhea between episodes of constipation and irregular bowel movements. He had mildly elevated AST on repeat LFTs. He is here to undergo hemorrhoid banding today   GI Procedures:  Colonoscopy 2011 tubular adenomas removed Colonoscopy 06/18/2013 chewable or adenomas without evidence of high-grade dysplasia, recommended repeat colonoscopy in 2017  Colonoscopy 05/07/2017 - Non-thrombosed internal hemorrhoids and internal hemorrhoids that prolapse with straining, but spontaneously regress to the resting position (Grade II) found on perianal exam. - One 8 mm polyp in the cecum, removed with a hot snare. Resected and retrieved. - One 7 mm polyp in the descending colon, removed with a hot snare. Resected and retrieved. - Non-bleeding internal hemorrhoids. EGD 05/07/2017 - Normal duodenal bulb and second portion of the duodenum. - Non-bleeding erosive gastropathy. Biopsied. - Esophagogastric landmarks identified. - Normal gastroesophageal junction and esophagus.  DIAGNOSIS:  A. STOMACH; COLD BIOPSY:  - ANTRAL AND OXYNTIC MUCOSA WITH MILD CHRONIC GASTRITIS.  - NEGATIVE FOR H. PYLORI, DYSPLASIA AND MALIGNANCY.   B. COLON POLYP, CECUM; HOT SNARE:  - TUBULAR ADENOMA.  - NEGATIVE FOR HIGH-GRADE DYSPLASIA AND MALIGNANCY.   C. COLON POLYP, DESCENDING; HOT SNARE:  - TUBULAR ADENOMA.  - NEGATIVE FOR HIGH-GRADE DYSPLASIA AND MALIGNANCY.   Past Medical History:  Diagnosis Date  . Anxiety   . BPH (benign prostatic hyperplasia)   . Chronic constipation   . Fatty liver disease, nonalcoholic   . GERD (gastroesophageal reflux disease)   . Hyperlipidemia   . Prediabetes   . Schizoaffective disorder [facility])     Past Surgical History:  Procedure Laterality Date  . COLONOSCOPY    . COLONOSCOPY WITH PROPOFOL  N/A 05/07/2017   Procedure: COLONOSCOPY WITH PROPOFOL;  Surgeon: Lin Landsman, MD;  Location: [facility] ENDOSCOPY;  Service: Gastroenterology;  Laterality: N/A;    . ESOPHAGOGASTRODUODENOSCOPY (EGD) WITH PROPOFOL N/A 05/07/2017   Procedure: ESOPHAGOGASTRODUODENOSCOPY (EGD) WITH PROPOFOL;  Surgeon: Lin Landsman, MD;  Location: [facility] ENDOSCOPY;  Service: Gastroenterology;  Laterality: N/A;  . none      Prior to Admission medications   Not on File    Family History  Problem Relation Age of Onset  . Diabetes Mother   . Heart disease Mother   . Prostate cancer Father   . Lung cancer Sister   . Leukemia Brother      Social History   Tobacco Use  . Smoking status: Never Smoker  . Smokeless tobacco: Never Used  Substance Use Topics  . Alcohol use: No  . Drug use: No    Allergies as of 09/18/2017  . (No Known Allergies)    Review of Systems:    All systems reviewed and negative except where noted in HPI.   Physical Exam:  BP 113/74   Pulse (!) 51   Temp 97.6 F (36.4 C) (Oral)   Ht 5\' 10"  (1.778 m)   Wt 206 lb 12.8 oz (93.8 kg)   BMI 29.67 kg/m  No LMP for male patient.  General:   Alert,  Well-developed, well-nourished, pleasant and cooperative in NAD Head:  Normocephalic and atraumatic. Eyes:  Sclera clear, no icterus.   Conjunctiva pink. Ears:  Normal auditory acuity. Nose:  No deformity, discharge, or lesions. Mouth:  No deformity or lesions,oropharynx pink & moist. Neck:  Supple; no masses or thyromegaly. Lungs:  Respirations even and unlabored.  Clear throughout to auscultation.   No wheezes, crackles, or rhonchi. No acute distress. Heart:  Regular rate and rhythm; no murmurs, clicks, rubs, or gallops. Abdomen:  Normal bowel sounds.  Obese, Soft, non-tender and non-distended without masses, hepatosplenomegaly or hernias noted.  No guarding or rebound tenderness.   Rectal: Nor performed Msk:  Symmetrical without gross deformities. Good, equal movement & strength bilaterally. Pulses:  Normal pulses noted. Extremities:  No clubbing or edema.  No cyanosis. Neurologic:  Alert and oriented x3;  grossly normal  neurologically. Skin:  Intact without significant lesions or rashes. No jaundice. Lymph Nodes:  No significant cervical adenopathy. Psych:  Alert and cooperative. Normal mood and affect.  Imaging Studies: Barium swallow 12/03/2016 Moderate esophageal dysmotility Spontaneous and provoked reflux to the level of the midesophagus No hiatal hernia Ultrasound abdomen limited 11/21/2016 Sludge in the gallbladder with no evidence of acute cholecystitis Diffuse hepatic steatosis  Labs LFTs normal on 11/21/2016 Hemoglobin A1c 6.4 Triglycerides 173 Creatinine 1.2  Assessment and Plan:   GEOFREY SILLIMAN is a 72 y.o. male with Obesity, hyperlipidemia, schizoaffective disorder, chronic GERD and chronic idiopathic constipation.  Chronic idiopathic constipation: Continue MiraLAX one to 2 times daily Reiterated on high-fiber diet and fiber supplements TSH levels are normal  Internal hemorrhoids: Perform hemorrhoid ligation today  History of tubular adenomas of colon Colonoscopy in 04/2017 revealed 2 subcentimeter tubular adenomas. No evidence of high-grade dysplasia Repeat colonoscopy in 5 years  Chronic GERD EGD unremarkable, no evidence of Barrett's He doesn't have regular heartburn  Fatty liver Based on ultrasound at the [place] LFTs are normal from 10/2016 Repeat LFTs revealed only mildly elevated AST Discussed about weight loss and lifestyle changes to prevent progression of fatty liver   Follow up in 2 weeks   Cephas Darby, MD

## 2017-09-18 NOTE — Progress Notes (Signed)
PROCEDURE NOTE: The patient presents with symptomatic grade 1 hemorrhoids, unresponsive to maximal medical therapy, requesting rubber band ligation of his/her hemorrhoidal disease.  All risks, benefits and alternative forms of therapy were described and informed consent was obtained.  In the Left Lateral Decubitus position (if anoscopy is performed) anoscopic examination revealed grade 1 hemorrhoids in the all position(s).   The decision was made to band the RA internal hemorrhoid, and the [place] was used to perform band ligation without complication.  Digital anorectal examination was then performed to assure proper positioning of the band, and to adjust the banded tissue as required.  The patient was discharged home without pain or other issues.  Dietary and behavioral recommendations were given and (if necessary - prescriptions were given), along with follow-up instructions.  The patient will return 2 weeks for follow-up and possible additional banding as required.  No complications were encountered and the patient tolerated the procedure well.  Cephas Darby, MD [address]  [place]  [place], [place] [address]  Main: [phone]  Fax: [phone] Pager: [phone]

## 2017-10-08 ENCOUNTER — Telehealth: Payer: Self-pay | Admitting: Gastroenterology

## 2017-10-08 NOTE — Telephone Encounter (Signed)
Spoke to patient regarding VA authorization. He understands he doesn't have any authorization and only wants to use the Medicare.

## 2017-10-09 ENCOUNTER — Encounter: Payer: Self-pay | Admitting: Gastroenterology

## 2017-10-09 ENCOUNTER — Ambulatory Visit (INDEPENDENT_AMBULATORY_CARE_PROVIDER_SITE_OTHER): Payer: Medicare Other | Admitting: Gastroenterology

## 2017-10-09 VITALS — BP 118/70 | HR 63 | Temp 98.0°F | Ht 70.0 in | Wt 202.4 lb

## 2017-10-09 DIAGNOSIS — K640 First degree hemorrhoids: Secondary | ICD-10-CM

## 2017-10-09 NOTE — Progress Notes (Signed)
PROCEDURE NOTE: The patient presents with symptomatic grade 1 hemorrhoids, unresponsive to maximal medical therapy, requesting rubber band ligation of his/her hemorrhoidal disease.  All risks, benefits and alternative forms of therapy were described and informed consent was obtained.  The decision was made to band the RP internal hemorrhoid, and the [facility] System was used to perform band ligation without complication.  Digital anorectal examination was then performed to assure proper positioning of the band, and to adjust the banded tissue as required.  The patient was discharged home without pain or other issues.  Dietary and behavioral recommendations were given and (if necessary - prescriptions were given), along with follow-up instructions.  The patient will return 2 weeks for follow-up and possible additional banding as required.  No complications were encountered and the patient tolerated the procedure well.  Deronda Christian R Jo Cerone, MD [address]  [address]  [place], [place] [address]  Main: [phone]  Fax: [phone] Pager: [phone]  

## 2017-10-23 ENCOUNTER — Encounter: Payer: Self-pay | Admitting: Gastroenterology

## 2017-10-23 ENCOUNTER — Ambulatory Visit (INDEPENDENT_AMBULATORY_CARE_PROVIDER_SITE_OTHER): Payer: Medicare Other | Admitting: Gastroenterology

## 2017-10-23 ENCOUNTER — Encounter (INDEPENDENT_AMBULATORY_CARE_PROVIDER_SITE_OTHER): Payer: Self-pay

## 2017-10-23 VITALS — BP 101/65 | HR 69 | Ht 70.0 in | Wt 203.4 lb

## 2017-10-23 DIAGNOSIS — K640 First degree hemorrhoids: Secondary | ICD-10-CM | POA: Diagnosis not present

## 2017-10-23 NOTE — Progress Notes (Signed)
PROCEDURE NOTE: The patient presents with symptomatic grade 1 hemorrhoids, unresponsive to maximal medical therapy, requesting rubber band ligation of his/her hemorrhoidal disease.  All risks, benefits and alternative forms of therapy were described and informed consent was obtained.   The decision was made to band the LL internal hemorrhoid, and the [place] was used to perform band ligation without complication.  Digital anorectal examination was then performed to assure proper positioning of the band, and to adjust the banded tissue as required.  The patient was discharged home without pain or other issues.  Dietary and behavioral recommendations were given and (if necessary - prescriptions were given), along with follow-up instructions.  The patient will return in 3  months for follow-up and possible additional banding as required.  No complications were encountered and the patient tolerated the procedure well.  Cephas Darby, MD [address]  [place]  [place], [place] [address]  Main: [phone]  Fax: [phone] Pager: [phone]

## 2018-01-22 ENCOUNTER — Ambulatory Visit: Payer: No Typology Code available for payment source | Admitting: Gastroenterology

## 2019-02-01 ENCOUNTER — Encounter: Payer: Self-pay | Admitting: Emergency Medicine

## 2019-02-01 ENCOUNTER — Emergency Department
Admission: EM | Admit: 2019-02-01 | Discharge: 2019-02-01 | Disposition: A | Discharge status: Home / Self Care | Payer: No Typology Code available for payment source | Attending: Emergency Medicine | Admitting: Emergency Medicine

## 2019-02-01 ENCOUNTER — Other Ambulatory Visit: Payer: Self-pay

## 2019-02-01 DIAGNOSIS — R251 Tremor, unspecified: Secondary | ICD-10-CM

## 2019-02-01 DIAGNOSIS — Z79899 Other long term (current) drug therapy: Secondary | ICD-10-CM | POA: Diagnosis not present

## 2019-02-01 DIAGNOSIS — G2401 Drug induced subacute dyskinesia: Secondary | ICD-10-CM | POA: Diagnosis not present

## 2019-02-01 DIAGNOSIS — Z7982 Long term (current) use of aspirin: Secondary | ICD-10-CM | POA: Insufficient documentation

## 2019-02-01 LAB — CBC WITH DIFFERENTIAL/PLATELET
Abs Immature Granulocytes: 0.02 10*3/uL (ref 0.00–0.07)
Basophils Absolute: 0 10*3/uL (ref 0.0–0.1)
Basophils Relative: 1 %
Eosinophils Absolute: 0.2 10*3/uL (ref 0.0–0.5)
Eosinophils Relative: 4 %
HCT: 40.9 % (ref 39.0–52.0)
Hemoglobin: 13.5 g/dL (ref 13.0–17.0)
Immature Granulocytes: 0 %
Lymphocytes Relative: 44 %
Lymphs Abs: 2.3 10*3/uL (ref 0.7–4.0)
MCH: 30.8 pg (ref 26.0–34.0)
MCHC: 33 g/dL (ref 30.0–36.0)
MCV: 93.4 fL (ref 80.0–100.0)
Monocytes Absolute: 0.2 10*3/uL (ref 0.1–1.0)
Monocytes Relative: 5 %
Neutro Abs: 2.4 10*3/uL (ref 1.7–7.7)
Neutrophils Relative %: 46 %
Platelets: 112 10*3/uL — ABNORMAL LOW (ref 150–400)
RBC: 4.38 MIL/uL (ref 4.22–5.81)
RDW: 13.8 % (ref 11.5–15.5)
WBC: 5.2 10*3/uL (ref 4.0–10.5)
nRBC: 0 % (ref 0.0–0.2)

## 2019-02-01 LAB — COMPREHENSIVE METABOLIC PANEL
ALT: 30 U/L (ref 0–44)
AST: 30 U/L (ref 15–41)
Albumin: 4.6 g/dL (ref 3.5–5.0)
Alkaline Phosphatase: 42 U/L (ref 38–126)
Anion gap: 8 (ref 5–15)
BUN: 12 mg/dL (ref 8–23)
CO2: 25 mmol/L (ref 22–32)
Calcium: 9.8 mg/dL (ref 8.9–10.3)
Chloride: 108 mmol/L (ref 98–111)
Creatinine, Ser: 1.09 mg/dL (ref 0.61–1.24)
GFR calc Af Amer: >60 mL/min (ref >60)
GFR calc non Af Amer: >60 mL/min (ref >60)
Glucose, Bld: 208 mg/dL — ABNORMAL HIGH (ref 70–99)
Potassium: 4 mmol/L (ref 3.5–5.1)
Sodium: 141 mmol/L (ref 135–145)
Total Bilirubin: 1.1 mg/dL (ref 0.3–1.2)
Total Protein: 7.6 g/dL (ref 6.5–8.1)

## 2019-02-01 LAB — LIPASE, BLOOD: Lipase: 33 U/L (ref 11–51)

## 2019-02-01 LAB — LITHIUM LEVEL: Lithium Lvl: 0.78 mmol/L (ref 0.60–1.20)

## 2019-02-01 NOTE — ED Triage Notes (Signed)
Pt was sent by his PCP office for concerns over possible high lithium level due to tremor. Pt denies pain or other complaints.

## 2019-02-01 NOTE — ED Provider Notes (Signed)
[facility] Emergency Department Provider Note  Time seen: 6:52 PM  I have reviewed the triage vital signs and the nursing notes.   HISTORY  Chief Complaint Tremors   HPI Ryan Pearson is a 73 y.o. male with a past medical history of anxiety, gastric reflux, hyperlipidemia, schizoaffective who presents to the emergency department for evaluation of shaking.  According to the patient he takes lithium, has been on the same dose for over 20 years, also has nonalcoholic liver dysfunction.  Over the past 3 to 4 weeks he has developed a tremor in his upper extremities especially while attempting to do something.  Wife also states he has been biting his tongue and inside of his mouth over the past 3 to 4 weeks as well.  Patient denies any recent medication changes.  No other medical complaints at this time.  Patient was referred by his psychiatrist who I spoke to on the phone earlier for evaluation and checking of his lithium level.  Denies any recent fever cough congestion shortness of breath.  Largely negative review of systems.  Patient calm cooperative, no acute distress.   Past Medical History:  Diagnosis Date  . Anxiety   . BPH (benign prostatic hyperplasia)   . Chronic constipation   . Fatty liver disease, nonalcoholic   . GERD (gastroesophageal reflux disease)   . Hyperlipidemia   . Prediabetes   . Schizoaffective disorder [facility])     Patient Active Problem List   Diagnosis Date Noted  . Gastroesophageal reflux disease   . Encounter for screening colonoscopy     Past Surgical History:  Procedure Laterality Date  . COLONOSCOPY    . COLONOSCOPY WITH PROPOFOL N/A 05/07/2017   Procedure: COLONOSCOPY WITH PROPOFOL;  Surgeon: Lin Landsman, MD;  Location: [facility] ENDOSCOPY;  Service: Gastroenterology;  Laterality: N/A;  . ESOPHAGOGASTRODUODENOSCOPY (EGD) WITH PROPOFOL N/A 05/07/2017   Procedure: ESOPHAGOGASTRODUODENOSCOPY (EGD) WITH PROPOFOL;  Surgeon: Lin Landsman, MD;  Location: [facility] ENDOSCOPY;  Service: Gastroenterology;  Laterality: N/A;  . none      Prior to Admission medications   Medication Sig Start Date End Date Taking? Authorizing Provider  alfuzosin (UROXATRAL) 10 MG 24 hr tablet Take 10 mg by mouth daily with breakfast.    [provider]  aspirin 81 MG chewable tablet Chew by mouth daily.    [provider]  Calcium Carbonate-Vitamin D3 (CALCIUM 600/VITAMIN D) 600-400 MG-UNIT TABS Take by mouth.    [provider]  carbamazepine (TEGRETOL) 200 MG tablet Take 200 mg by mouth 3 (three) times daily.    [provider]  chlorhexidine (PERIDEX) 0.12 % solution Use as directed 15 mLs in the mouth or throat 2 (two) times daily.    [provider]  Cholecalciferol (VITAMIN D3) 1000 units CAPS Take by mouth.    [provider]  coal tar-salicylic acid 2 % shampoo Apply topically daily as needed for itching.    [provider]  fluticasone (FLONASE) 50 MCG/ACT nasal spray Place into both nostrils daily.    [provider]  Lactobacillus Acidophilus POWD by Does not apply route.    [provider]  loratadine (CLARITIN) 10 MG tablet Take 10 mg by mouth daily as needed for allergies.    [provider]  Omega-3 Fatty Acids (FISH OIL) 1000 MG CAPS Take 2 capsules by mouth 2 (two) times daily.    [provider]  oxybutynin (DITROPAN) 5 MG tablet Take 5 mg by  mouth 3 (three) times daily.    [provider]  Polyethylene Glycol 3350-GRX POWD Take by mouth.    [provider]  propranolol (INDERAL) 10 MG tablet Take 10 mg by mouth 2 (two) times daily.    [provider]  psyllium (METAMUCIL) 58.6 % packet Take 1 packet by mouth daily.    [provider]  risperiDONE (RISPERDAL M-TABS) 4 MG disintegrating tablet Take 4 mg by mouth daily.    [provider]  sertraline (ZOLOFT) 25 MG tablet Take 25 mg by  mouth daily.    [provider]    No Known Allergies  Family History  Problem Relation Age of Onset  . Diabetes Mother   . Heart disease Mother   . Prostate cancer Father   . Lung cancer Sister   . Leukemia Brother     Social History Social History   Tobacco Use  . Smoking status: Never Smoker  . Smokeless tobacco: Never Used  Substance Use Topics  . Alcohol use: No  . Drug use: No    Review of Systems Constitutional: Negative for fever ENT: Negative for recent illness/congestion Cardiovascular: Negative for chest pain. Respiratory: Negative for shortness of breath. Gastrointestinal: Negative for abdominal pain Musculoskeletal: Upper extremity tremor. Neurological: Negative for headache All other ROS negative  ____________________________________________   PHYSICAL EXAM:  VITAL SIGNS: ED Triage Vitals [02/01/19 1753]  Enc Vitals Group     BP 136/64     Pulse Rate 62     Resp 18     Temp 98 F (36.7 C)     Temp Source Oral     SpO2 98 %     Weight 170 lb (77.1 kg)     Height 5\' 10"  (1.778 m)     Head Circumference      Peak Flow      Pain Score 0     Pain Loc      Pain Edu?      Excl. in [place]?    Constitutional: Alert and oriented. Well appearing and in no distress. Eyes: Normal exam ENT      Head: Normocephalic and atraumatic      Mouth/Throat: Mucous membranes are moist.  No tardive dyskinesia noted at this time. Cardiovascular: Normal rate, regular rhythm. No murmur Respiratory: Normal respiratory effort without tachypnea nor retractions. Breath sounds are clear  Gastrointestinal: Soft and nontender. No distention. Musculoskeletal: Nontender with normal range of motion in all extremities. Neurologic:  Normal speech and language. No gross focal neurologic deficits Skin:  Skin is warm, dry and intact.  Psychiatric: Mood and affect are normal.   ____________________________________________   INITIAL IMPRESSION / ASSESSMENT AND PLAN /  ED COURSE  Pertinent labs & imaging results that were available during my care of the patient were reviewed by me and considered in my medical decision making (see chart for details).   Patient presents to the emergency department for evaluation of new onset upper extremity tremor appears to be more of an intention tremor, as well as mouth chewing possibly tardive dyskinesia, although not currently noted.  Overall the patient appears well, calm cooperative, no distress.  We will check labs including lithium level and continue to closely monitor the patient in the emergency department.  Patient agreeable to plan of care.  Patient's medical work-up is largely nonrevealing.  Lithium level in the therapeutic window.  Could possibly be developing tardive dyskinesia.  We will have the patient follow-up with his psychiatrist  to discuss whether or not a change in medications is warranted.  Overall the patient appears well we will discharge with psychiatric follow-up.  Ryan Pearson was evaluated in Emergency Department on 02/01/2019 for the symptoms described in the history of present illness. He was evaluated in the context of the global COVID-19 pandemic, which necessitated consideration that the patient might be at risk for infection with the SARS-CoV-2 virus that causes COVID-19. Institutional protocols and algorithms that pertain to the evaluation of patients at risk for COVID-19 are in a state of rapid change based on information released by regulatory bodies including the CDC and federal and state organizations. These policies and algorithms were followed during the patient's care in the ED.  ____________________________________________   FINAL CLINICAL IMPRESSION(S) / ED DIAGNOSES  Tremor   Harvest Dark, MD 02/01/19 [phone]

## 2022-05-15 DIAGNOSIS — E119 Type 2 diabetes mellitus without complications: Secondary | ICD-10-CM | POA: Diagnosis not present

## 2022-05-15 DIAGNOSIS — Z0000 Encounter for general adult medical examination without abnormal findings: Secondary | ICD-10-CM | POA: Diagnosis not present

## 2022-05-15 DIAGNOSIS — E782 Mixed hyperlipidemia: Secondary | ICD-10-CM | POA: Diagnosis not present

## 2022-05-16 DIAGNOSIS — E782 Mixed hyperlipidemia: Secondary | ICD-10-CM | POA: Diagnosis not present

## 2022-05-16 DIAGNOSIS — E119 Type 2 diabetes mellitus without complications: Secondary | ICD-10-CM | POA: Diagnosis not present

## 2022-08-15 ENCOUNTER — Ambulatory Visit: Payer: Self-pay | Admitting: Internal Medicine

## 2023-03-16 ENCOUNTER — Ambulatory Visit: Admission: RE | Admit: 2023-03-16 | Payer: Medicare Other | Source: Home / Self Care | Admitting: Gastroenterology

## 2023-03-16 ENCOUNTER — Encounter: Admission: RE | Payer: Self-pay | Source: Home / Self Care

## 2023-03-16 SURGERY — COLONOSCOPY WITH PROPOFOL
Anesthesia: General

## 2023-05-22 ENCOUNTER — Ambulatory Visit: Payer: Medicare Other | Admitting: Anesthesiology

## 2023-05-22 ENCOUNTER — Encounter
Admission: RE | Disposition: A | Discharge status: Home / Self Care | Payer: Self-pay | Source: Home / Self Care | Attending: Gastroenterology

## 2023-05-22 ENCOUNTER — Ambulatory Visit
Admission: RE | Admit: 2023-05-22 | Discharge: 2023-05-22 | Disposition: A | Discharge status: Home / Self Care | Payer: Medicare Other | Attending: Gastroenterology | Admitting: Gastroenterology

## 2023-05-22 DIAGNOSIS — K2100 Gastro-esophageal reflux disease with esophagitis, without bleeding: Secondary | ICD-10-CM | POA: Insufficient documentation

## 2023-05-22 DIAGNOSIS — K648 Other hemorrhoids: Secondary | ICD-10-CM | POA: Insufficient documentation

## 2023-05-22 DIAGNOSIS — Z860101 Personal history of adenomatous and serrated colon polyps: Secondary | ICD-10-CM | POA: Diagnosis not present

## 2023-05-22 DIAGNOSIS — K640 First degree hemorrhoids: Secondary | ICD-10-CM | POA: Insufficient documentation

## 2023-05-22 DIAGNOSIS — F259 Schizoaffective disorder, unspecified: Secondary | ICD-10-CM | POA: Insufficient documentation

## 2023-05-22 DIAGNOSIS — K2289 Other specified disease of esophagus: Secondary | ICD-10-CM | POA: Insufficient documentation

## 2023-05-22 DIAGNOSIS — D120 Benign neoplasm of cecum: Secondary | ICD-10-CM | POA: Insufficient documentation

## 2023-05-22 DIAGNOSIS — Z79899 Other long term (current) drug therapy: Secondary | ICD-10-CM | POA: Diagnosis not present

## 2023-05-22 DIAGNOSIS — F4310 Post-traumatic stress disorder, unspecified: Secondary | ICD-10-CM | POA: Diagnosis not present

## 2023-05-22 DIAGNOSIS — K7581 Nonalcoholic steatohepatitis (NASH): Secondary | ICD-10-CM | POA: Diagnosis not present

## 2023-05-22 DIAGNOSIS — E785 Hyperlipidemia, unspecified: Secondary | ICD-10-CM | POA: Insufficient documentation

## 2023-05-22 DIAGNOSIS — N400 Enlarged prostate without lower urinary tract symptoms: Secondary | ICD-10-CM | POA: Insufficient documentation

## 2023-05-22 DIAGNOSIS — Z1211 Encounter for screening for malignant neoplasm of colon: Secondary | ICD-10-CM | POA: Insufficient documentation

## 2023-05-22 DIAGNOSIS — F419 Anxiety disorder, unspecified: Secondary | ICD-10-CM | POA: Diagnosis not present

## 2023-05-22 DIAGNOSIS — Z09 Encounter for follow-up examination after completed treatment for conditions other than malignant neoplasm: Secondary | ICD-10-CM | POA: Diagnosis not present

## 2023-05-22 DIAGNOSIS — K2970 Gastritis, unspecified, without bleeding: Secondary | ICD-10-CM | POA: Diagnosis not present

## 2023-05-22 DIAGNOSIS — E118 Type 2 diabetes mellitus with unspecified complications: Secondary | ICD-10-CM | POA: Diagnosis not present

## 2023-05-22 DIAGNOSIS — K7460 Unspecified cirrhosis of liver: Secondary | ICD-10-CM | POA: Diagnosis not present

## 2023-05-22 DIAGNOSIS — D123 Benign neoplasm of transverse colon: Secondary | ICD-10-CM | POA: Diagnosis not present

## 2023-05-22 HISTORY — PX: POLYPECTOMY: SHX5525

## 2023-05-22 HISTORY — PX: HEMOSTASIS CLIP PLACEMENT: SHX6857

## 2023-05-22 HISTORY — PX: BIOPSY: SHX5522

## 2023-05-22 HISTORY — PX: ESOPHAGOGASTRODUODENOSCOPY (EGD) WITH PROPOFOL: SHX5813

## 2023-05-22 HISTORY — PX: ESOPHAGEAL BRUSHING: SHX6842

## 2023-05-22 HISTORY — PX: COLONOSCOPY WITH PROPOFOL: SHX5780

## 2023-05-22 LAB — KOH PREP

## 2023-05-22 SURGERY — COLONOSCOPY WITH PROPOFOL
Anesthesia: General

## 2023-05-22 MED ORDER — SODIUM CHLORIDE 0.9 % IV SOLN
INTRAVENOUS | Status: DC
  Administered 2023-05-22: 20 mL/h via INTRAVENOUS

## 2023-05-22 MED ORDER — PROPOFOL 10 MG/ML IV BOLUS
INTRAVENOUS | Status: DC | PRN
  Administered 2023-05-22: 50 mg via INTRAVENOUS

## 2023-05-22 MED ORDER — PROPOFOL 10 MG/ML IV BOLUS
INTRAVENOUS | Status: AC
  Filled 2023-05-22: qty 20

## 2023-05-22 MED ORDER — PROPOFOL 500 MG/50ML IV EMUL
INTRAVENOUS | Status: DC | PRN
  Administered 2023-05-22: 100 ug/kg/min via INTRAVENOUS

## 2023-05-22 MED ORDER — LIDOCAINE HCL (PF) 2 % IJ SOLN
INTRAMUSCULAR | Status: AC
  Filled 2023-05-22: qty 5

## 2023-05-22 MED ORDER — PROPOFOL 10 MG/ML IV BOLUS
INTRAVENOUS | Status: AC
  Filled 2023-05-22: qty 40

## 2023-05-22 MED ORDER — LIDOCAINE HCL (CARDIAC) PF 100 MG/5ML IV SOSY
PREFILLED_SYRINGE | INTRAVENOUS | Status: DC | PRN
  Administered 2023-05-22: 50 mg via INTRAVENOUS

## 2023-05-22 MED ORDER — STERILE WATER FOR IRRIGATION IR SOLN
Status: DC | PRN
  Administered 2023-05-22: 60 mL

## 2023-05-22 NOTE — H&P (Signed)
Outpatient short stay form Pre-procedure 05/22/2023  Regis Bill, MD  Primary Physician: [facility]  Reason for visit:  MASLD Cirrhosis and surveillance colonoscopy  History of present illness:    77 y/o gentleman with PTSD, MASLD cirrhosis, DM II, and hypothyroidism here for EGD for MASLD cirrhosis and colonoscopy for surveillance. Last colonoscopy in 2014 with small polyps. No blood thinners. No family history of GI malignancies. History of umbilical hernia repair. Platelets are 112 and no listed INR but albumin is 4.4.     Current Facility-Administered Medications:    0.9 %  sodium chloride infusion, , Intravenous, Continuous, Kaoru Rezendes, Rossie Muskrat, MD, Last Rate: 20 mL/hr at 05/22/23 1018, 20 mL/hr at 05/22/23 1018  Medications Prior to Admission  Medication Sig Dispense Refill Last Dose   alfuzosin (UROXATRAL) 10 MG 24 hr tablet Take 10 mg by mouth daily with breakfast.   Past Week   aspirin 81 MG chewable tablet Chew by mouth daily.   Past Week   Calcium Carbonate-Vitamin D3 (CALCIUM 600/VITAMIN D) 600-400 MG-UNIT TABS Take by mouth.   Past Week   carbamazepine (TEGRETOL) 200 MG tablet Take 200 mg by mouth 3 (three) times daily.   Past Week   chlorhexidine (PERIDEX) 0.12 % solution Use as directed 15 mLs in the mouth or throat 2 (two) times daily.   Past Week   Cholecalciferol (VITAMIN D3) 1000 units CAPS Take by mouth.   Past Week   coal tar-salicylic acid 2 % shampoo Apply topically daily as needed for itching.   Past Week   fluticasone (FLONASE) 50 MCG/ACT nasal spray Place into both nostrils daily.   Past Week   Lactobacillus Acidophilus POWD by Does not apply route.   Past Week   loratadine (CLARITIN) 10 MG tablet Take 10 mg by mouth daily as needed for allergies.   Past Week   Omega-3 Fatty Acids (FISH OIL) 1000 MG CAPS Take 2 capsules by mouth 2 (two) times daily.   Past Week   oxybutynin (DITROPAN) 5 MG tablet Take 5 mg by mouth 3 (three) times daily.    Past Week   Polyethylene Glycol 3350-GRX POWD Take by mouth.   Past Week   propranolol (INDERAL) 10 MG tablet Take 10 mg by mouth 2 (two) times daily.   Past Week   psyllium (METAMUCIL) 58.6 % packet Take 1 packet by mouth daily.   Past Week   risperiDONE (RISPERDAL M-TABS) 4 MG disintegrating tablet Take 4 mg by mouth daily.   Past Week   sertraline (ZOLOFT) 25 MG tablet Take 25 mg by mouth daily.   Past Week     No Known Allergies   Past Medical History:  Diagnosis Date   Anxiety    BPH (benign prostatic hyperplasia)    Chronic constipation    Fatty liver disease, nonalcoholic    GERD (gastroesophageal reflux disease)    Hyperlipidemia    Prediabetes    Schizoaffective disorder (HCC)     Review of systems:  Otherwise negative.    Physical Exam  Gen: Alert, oriented. Appears stated age.  HEENT: PERRLA. Lungs: No respiratory distress CV: RRR Abd: soft, benign, no masses Ext: No edema    Planned procedures: Proceed with EGD/colonoscopy. The patient understands the nature of the planned procedure, indications, risks, alternatives and potential complications including but not limited to bleeding, infection, perforation, damage to internal organs and possible oversedation/side effects from anesthesia. The patient agrees and gives consent to proceed.  Please refer  to procedure notes for findings, recommendations and patient disposition/instructions.     Regis Bill, MD [facility] Gastroenterology

## 2023-05-22 NOTE — Transfer of Care (Signed)
Immediate Anesthesia Transfer of Care Note  Patient: Ryan Pearson  Procedure(s) Performed: COLONOSCOPY WITH PROPOFOL ESOPHAGOGASTRODUODENOSCOPY (EGD) WITH PROPOFOL BIOPSY ESOPHAGEAL BRUSHING POLYPECTOMY HEMOSTASIS CLIP PLACEMENT  Patient Location: PACU  Anesthesia Type:MAC  Level of Consciousness: awake  Airway & Oxygen Therapy: Patient Spontanous Breathing  Post-op Assessment: Report given to RN and Post -op Vital signs reviewed and stable  Post vital signs: Reviewed and stable  Last Vitals:  Vitals Value Taken Time  BP 108/64 05/22/23 1214  Temp 36.3 C 05/22/23 1213  Pulse 69 05/22/23 1214  Resp 16 05/22/23 1214  SpO2 100 % 05/22/23 1214  Vitals shown include unfiled device data.  Last Pain:  Vitals:   05/22/23 1213  TempSrc: Temporal  PainSc: Asleep         Complications: No notable events documented.

## 2023-05-22 NOTE — Op Note (Signed)
[facility] Gastroenterology Patient Name: Ryan Pearson Procedure Date: 05/22/2023 11:12 AM MRN: [id number] Account #: [id number] Date of Birth: 04/11/1946 Admit Type: Outpatient Age: 77 Room: [facility] ENDO ROOM 3 Gender: Male Note Status: Finalized Instrument Name: Prentice Docker [id number] Procedure:             Colonoscopy Indications:           Surveillance: Personal history of adenomatous polyps                         on last colonoscopy > 5 years ago Providers:             Eather Colas MD, MD Referring MD:          Eather Colas MD, MD (Referring MD), [facility] (Referring MD) Medicines:             Monitored Anesthesia Care Complications:         No immediate complications. Estimated blood loss:                         Minimal. Procedure:             Pre-Anesthesia Assessment:                        - Prior to the procedure, a History and Physical was                         performed, and patient medications and allergies were                         reviewed. The patient is competent. The risks and                         benefits of the procedure and the sedation options and                         risks were discussed with the patient. All questions                         were answered and informed consent was obtained.                         Patient identification and proposed procedure were                         verified by the physician, the nurse, the                         anesthesiologist, the anesthetist and the technician                         in the endoscopy suite. Mental Status Examination:                         alert and oriented. Airway Examination: normal  oropharyngeal airway and neck mobility. Respiratory                         Examination: clear to auscultation. CV Examination:                         normal. Prophylactic Antibiotics: The patient does not                          require prophylactic antibiotics. Prior                         Anticoagulants: The patient has taken no anticoagulant                         or antiplatelet agents. ASA Grade Assessment: III - A                         patient with severe systemic disease. After reviewing                         the risks and benefits, the patient was deemed in                         satisfactory condition to undergo the procedure. The                         anesthesia plan was to use monitored anesthesia care                         (MAC). Immediately prior to administration of                         medications, the patient was re-assessed for adequacy                         to receive sedatives. The heart rate, respiratory                         rate, oxygen saturations, blood pressure, adequacy of                         pulmonary ventilation, and response to care were                         monitored throughout the procedure. The physical                         status of the patient was re-assessed after the                         procedure.                        After obtaining informed consent, the colonoscope was                         passed under direct vision. Throughout the procedure,  the patient's blood pressure, pulse, and oxygen                         saturations were monitored continuously. The                         Colonoscope was introduced through the anus and                         advanced to the the cecum, identified by appendiceal                         orifice and ileocecal valve. The colonoscopy was                         somewhat difficult due to significant looping.                         Successful completion of the procedure was aided by                         applying abdominal pressure. The patient tolerated the                         procedure well. The quality of the bowel preparation                         was fair.  The ileocecal valve, appendiceal orifice,                         and rectum were photographed. Findings:      The perianal and digital rectal examinations were normal.      A 7 mm polyp was found in the ileocecal valve. The polyp was sessile.       The polyp was removed with a cold snare. Resection was complete, but the       polyp tissue was only partially retrieved. Estimated blood loss was       minimal.      Two sessile polyps were found in the transverse colon. The polyps were 4       to 5 mm in size. These polyps were removed with a cold snare. Resection       and retrieval were complete. Estimated blood loss was minimal.      A 10 mm polyp was found in the transverse colon. The polyp was sessile.       The polyp was removed with a cold snare. Resection and retrieval were       complete. To prevent bleeding post-intervention, one hemostatic clip was       successfully placed. There was no bleeding during, or at the end, of the       procedure.      Internal hemorrhoids were found during retroflexion. The hemorrhoids       were Grade I (internal hemorrhoids that do not prolapse).      The exam was otherwise without abnormality on direct and retroflexion       views. Impression:            - Preparation of the colon was fair.                        -  One 7 mm polyp at the ileocecal valve, removed with                         a cold snare. Complete resection. Partial retrieval.                        - Two 4 to 5 mm polyps in the transverse colon,                         removed with a cold snare. Resected and retrieved.                        - One 10 mm polyp in the transverse colon, removed                         with a cold snare. Resected and retrieved. Clip was                         placed.                        - Internal hemorrhoids.                        - The examination was otherwise normal on direct and                         retroflexion views. Recommendation:         - Discharge patient to home.                        - Resume previous diet.                        - Continue present medications.                        - Await pathology results.                        - Repeat colonoscopy in 1-2 years because the bowel                         preparation was suboptimal if benefits outweigh risks.                        - Return to referring physician as previously                         scheduled. Procedure Code(s):     --- Professional ---                        [phone], Colonoscopy, flexible; with removal of                         tumor(s), polyp(s), or other lesion(s) by snare                         technique Diagnosis Code(s):     --- Professional ---  Z86.010, Personal history of colonic polyps                        K64.0, First degree hemorrhoids                        D12.0, Benign neoplasm of cecum                        D12.3, Benign neoplasm of transverse colon (hepatic                         flexure or splenic flexure) CPT copyright 2022 American Medical Association. All rights reserved. The codes documented in this report are preliminary and upon coder review may  be revised to meet current compliance requirements. Eather Colas MD, MD 05/22/2023 12:20:27 PM Number of Addenda: 0 Note Initiated On: 05/22/2023 11:12 AM Scope Withdrawal Time: 0 hours 16 minutes 56 seconds  Total Procedure Duration: 0 hours 34 minutes 20 seconds  Estimated Blood Loss:  Estimated blood loss was minimal.      [facility]

## 2023-05-22 NOTE — Interval H&P Note (Signed)
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  The various methods of treatment have been discussed with the patient and family. After consideration of risks, benefits and other options for treatment, the patient has consented to  Procedure(s): COLONOSCOPY WITH PROPOFOL (N/A) ESOPHAGOGASTRODUODENOSCOPY (EGD) WITH PROPOFOL (N/A) as a surgical intervention.  The patient's history has been reviewed, patient examined, no change in status, stable for surgery.  I have reviewed the patient's chart and labs.  Questions were answered to the patient's satisfaction.     Regis Bill  Ok to proceed with EGD/Colonoscopy

## 2023-05-22 NOTE — Anesthesia Postprocedure Evaluation (Signed)
Anesthesia Post Note  Patient: Ryan Pearson  Procedure(s) Performed: COLONOSCOPY WITH PROPOFOL ESOPHAGOGASTRODUODENOSCOPY (EGD) WITH PROPOFOL BIOPSY ESOPHAGEAL BRUSHING POLYPECTOMY HEMOSTASIS CLIP PLACEMENT  Anesthesia Type: General Anesthetic complications: no   No notable events documented.   Last Vitals:  Vitals:   05/22/23 1223 05/22/23 1233  BP: 128/78 129/75  Pulse: 66 (!) 59  Resp: 15 12  Temp: (!) 36.3 C (!) 36.3 C  SpO2: 100% 100%    Last Pain:  Vitals:   05/22/23 1233  TempSrc: Temporal  PainSc: 0-No pain                 VAN STAVEREN,Kanoa Phillippi

## 2023-05-22 NOTE — Op Note (Signed)
[facility] Gastroenterology Patient Name: Ryan Pearson Procedure Date: 05/22/2023 11:12 AM MRN: [id number] Account #: [id number] Date of Birth: 05-20-1946 Admit Type: Outpatient Age: 77 Room: [facility] ENDO ROOM 3 Gender: Male Note Status: Finalized Instrument Name: Upper Endoscope [id number] Procedure:             Upper GI endoscopy Indications:           Cirrhosis rule out esophageal varices Providers:             Eather Colas MD, MD Referring MD:          Eather Colas MD, MD (Referring MD), [facility] (Referring MD) Medicines:             Monitored Anesthesia Care Complications:         No immediate complications. Estimated blood loss:                         Minimal. Procedure:             Pre-Anesthesia Assessment:                        - Prior to the procedure, a History and Physical was                         performed, and patient medications and allergies were                         reviewed. The patient is competent. The risks and                         benefits of the procedure and the sedation options and                         risks were discussed with the patient. All questions                         were answered and informed consent was obtained.                         Patient identification and proposed procedure were                         verified by the physician, the nurse, the                         anesthesiologist, the anesthetist and the technician                         in the endoscopy suite. Mental Status Examination:                         alert and oriented. Airway Examination: normal                         oropharyngeal airway and neck mobility. Respiratory  Examination: clear to auscultation. CV Examination:                         normal. Prophylactic Antibiotics: The patient does not                         require prophylactic antibiotics. Prior                          Anticoagulants: The patient has taken no anticoagulant                         or antiplatelet agents. ASA Grade Assessment: III - A                         patient with severe systemic disease. After reviewing                         the risks and benefits, the patient was deemed in                         satisfactory condition to undergo the procedure. The                         anesthesia plan was to use monitored anesthesia care                         (MAC). Immediately prior to administration of                         medications, the patient was re-assessed for adequacy                         to receive sedatives. The heart rate, respiratory                         rate, oxygen saturations, blood pressure, adequacy of                         pulmonary ventilation, and response to care were                         monitored throughout the procedure. The physical                         status of the patient was re-assessed after the                         procedure.                        After obtaining informed consent, the endoscope was                         passed under direct vision. Throughout the procedure,                         the patient's blood pressure, pulse, and oxygen  saturations were monitored continuously. The Endoscope                         was introduced through the mouth, and advanced to the                         second part of duodenum. The upper GI endoscopy was                         accomplished without difficulty. The patient tolerated                         the procedure well. Findings:      White nummular lesions were noted in the upper third of the esophagus       and in the middle third of the esophagus. Brushings for KOH prep were       obtained in the middle third of the esophagus.      There is no endoscopic evidence of varices in the entire esophagus.      Patchy mild inflammation characterized by  erythema was found in the       gastric antrum. Biopsies were taken with a cold forceps for Helicobacter       pylori testing. Estimated blood loss was minimal.      The examined duodenum was normal. Impression:            - White nummular lesions in esophageal mucosa.                         Brushings performed.                        - Gastritis. Biopsied.                        - Normal examined duodenum. Recommendation:        - Discharge patient to home.                        - Resume previous diet.                        - Continue present medications.                        - Await pathology results.                        - Return to referring physician as previously                         scheduled. Procedure Code(s):     --- Professional ---                        [phone], Esophagogastroduodenoscopy, flexible,                         transoral; with biopsy, single or multiple Diagnosis Code(s):     --- Professional ---                        K22.89, Other specified disease  of esophagus                        K29.70, Gastritis, unspecified, without bleeding                        K74.60, Unspecified cirrhosis of liver CPT copyright 2022 [employer]. All rights reserved. The codes documented in this report are preliminary and upon coder review may  be revised to meet current compliance requirements. Eather Colas MD, MD 05/22/2023 12:14:38 PM Number of Addenda: 0 Note Initiated On: 05/22/2023 11:12 AM Estimated Blood Loss:  Estimated blood loss was minimal.      [facility]

## 2023-05-22 NOTE — Anesthesia Preprocedure Evaluation (Signed)
Anesthesia Evaluation  Patient identified by MRN, date of birth, ID band Patient awake    Reviewed: Allergy & Precautions, NPO status , Patient's Chart, lab work & pertinent test results  Airway Mallampati: III  TM Distance: >3 FB Neck ROM: full  Mouth opening: Limited Mouth Opening  Dental  (+) Teeth Intact   Pulmonary neg pulmonary ROS   Pulmonary exam normal breath sounds clear to auscultation       Cardiovascular Exercise Tolerance: Good negative cardio ROS Normal cardiovascular exam Rhythm:Regular Rate:Normal     Neuro/Psych   Anxiety   Schizophrenia  negative neurological ROS  negative psych ROS   GI/Hepatic negative GI ROS, Neg liver ROS,GERD  Medicated,,  Endo/Other  negative endocrine ROS    Renal/GU negative Renal ROS  negative genitourinary   Musculoskeletal negative musculoskeletal ROS (+)    Abdominal Normal abdominal exam  (+)   Peds negative pediatric ROS (+)  Hematology negative hematology ROS (+)   Anesthesia Other Findings Past Medical History: No date: Anxiety No date: BPH (benign prostatic hyperplasia) No date: Chronic constipation No date: Fatty liver disease, nonalcoholic No date: GERD (gastroesophageal reflux disease) No date: Hyperlipidemia No date: Prediabetes No date: Schizoaffective disorder [facility])  Past Surgical History: No date: COLONOSCOPY 05/07/2017: COLONOSCOPY WITH PROPOFOL; N/A     Comment:  Procedure: COLONOSCOPY WITH PROPOFOL;  Surgeon: Toney Reil, MD;  Location: [facility] ENDOSCOPY;  Service:               Gastroenterology;  Laterality: N/A; 05/07/2017: ESOPHAGOGASTRODUODENOSCOPY (EGD) WITH PROPOFOL; N/A     Comment:  Procedure: ESOPHAGOGASTRODUODENOSCOPY (EGD) WITH               PROPOFOL;  Surgeon: Toney Reil, MD;  Location:               [facility] ENDOSCOPY;  Service: Gastroenterology;  Laterality:               N/A; No date: none  BMI    Body  Mass Index: 24.77 kg/m      Reproductive/Obstetrics negative OB ROS                             Anesthesia Physical Anesthesia Plan  ASA: 3  Anesthesia Plan: General   Post-op Pain Management:    Induction: Intravenous  PONV Risk Score and Plan: Propofol infusion and TIVA  Airway Management Planned: Natural Airway and Nasal Cannula  Additional Equipment:   Intra-op Plan:   Post-operative Plan:   Informed Consent: I have reviewed the patients History and Physical, chart, labs and discussed the procedure including the risks, benefits and alternatives for the proposed anesthesia with the patient or authorized representative who has indicated his/her understanding and acceptance.     Dental Advisory Given  Plan Discussed with: CRNA and Surgeon  Anesthesia Plan Comments:        Anesthesia Quick Evaluation

## 2023-05-25 LAB — SURGICAL PATHOLOGY

## 2023-05-26 ENCOUNTER — Encounter: Payer: Self-pay | Admitting: Gastroenterology

## 2023-08-10 ENCOUNTER — Emergency Department: Payer: No Typology Code available for payment source

## 2023-08-10 ENCOUNTER — Other Ambulatory Visit: Payer: Self-pay

## 2023-08-10 ENCOUNTER — Emergency Department
Admission: EM | Admit: 2023-08-10 | Discharge: 2023-08-10 | Disposition: A | Discharge status: Home / Self Care | Payer: No Typology Code available for payment source | Attending: Emergency Medicine | Admitting: Emergency Medicine

## 2023-08-10 DIAGNOSIS — M545 Low back pain, unspecified: Secondary | ICD-10-CM | POA: Diagnosis present

## 2023-08-10 DIAGNOSIS — M461 Sacroiliitis, not elsewhere classified: Secondary | ICD-10-CM | POA: Diagnosis not present

## 2023-08-10 DIAGNOSIS — M5442 Lumbago with sciatica, left side: Secondary | ICD-10-CM | POA: Diagnosis not present

## 2023-08-10 MED ORDER — IBUPROFEN 600 MG PO TABS: 600.0000 mg | ORAL_TABLET | Freq: Three times a day (TID) | ORAL | 0 refills | Status: AC | PRN

## 2023-08-10 MED ORDER — LIDOCAINE 5 % EX PTCH: 1.0000 | MEDICATED_PATCH | Freq: Two times a day (BID) | CUTANEOUS | 0 refills | Status: DC

## 2023-08-10 MED ORDER — OXYCODONE HCL 5 MG PO TABS: 2.5000 mg | ORAL_TABLET | Freq: Three times a day (TID) | ORAL | 0 refills | Status: AC | PRN

## 2023-08-10 MED ORDER — IBUPROFEN 600 MG PO TABS: 600.0000 mg | ORAL_TABLET | Freq: Four times a day (QID) | ORAL | 0 refills | Status: DC | PRN

## 2023-08-10 MED ORDER — KETOROLAC TROMETHAMINE 30 MG/ML IJ SOLN
30.0000 mg | Freq: Once | INTRAMUSCULAR | Status: AC
  Administered 2023-08-10: 30 mg via INTRAMUSCULAR
  Filled 2023-08-10: qty 1

## 2023-08-10 MED ORDER — LIDOCAINE 5 % EX PTCH: 1.0000 | MEDICATED_PATCH | Freq: Two times a day (BID) | CUTANEOUS | 0 refills | Status: AC

## 2023-08-10 MED ORDER — OXYCODONE HCL 5 MG PO TABS
5.0000 mg | ORAL_TABLET | Freq: Once | ORAL | Status: AC
  Administered 2023-08-10: 5 mg via ORAL
  Filled 2023-08-10: qty 1

## 2023-08-10 MED ORDER — LIDOCAINE 5 % EX PTCH
1.0000 | MEDICATED_PATCH | Freq: Once | CUTANEOUS | Status: DC
  Administered 2023-08-10: 1 via TRANSDERMAL
  Filled 2023-08-10: qty 1

## 2023-08-10 NOTE — Discharge Instructions (Addendum)
 Try to take the pain medication at nighttime if it is causing drowsiness and use a capful of MiraLAX every time he takes the oxycodone  to prevent constipation.  Please call the orthopedic number to make follow-up as if this is still continuing you may need to see a physical therapist, have injections done to try to help with the pain.  Return to the ER if develops any weakness, numbness or worsening symptoms  IMPRESSION: Normal radiographs of the left hip. IMPRESSION: Moderate disc space narrowing at L2-3, L3-4, L4-5 and L5-S1. Endplate osteophytes more prominent at L3-4 and L5-S1. Lower lumbar facet osteoarthritis. Findings could certainly relate to back pain or neural compression.

## 2023-08-10 NOTE — ED Provider Triage Note (Signed)
 Emergency Medicine Provider Triage Evaluation Note  Ryan Pearson , a 78 y.o. male  was evaluated in triage.  Pt complains of chronic and recurrent left lower back pain with radiation down the left leg.  Review of Systems  Positive: Left lower back pain Negative: Saddle anesthesia, urinary symptoms  Physical Exam  BP 120/72   Pulse 64   Temp 97.7 F (36.5 C) (Oral)   Resp 17   Ht 5\' 10"  (1.778 m)   Wt 76.7 kg   SpO2 96%   BMI 24.25 kg/m  Gen:   Awake, no distress   Resp:  Normal effort  MSK:   Moves extremities without difficulty  Other:    Medical Decision Making  Medically screening exam initiated at 12:00 PM.  Appropriate orders placed.  Ryan Pearson was informed that the remainder of the evaluation will be completed by another provider, this initial triage assessment does not replace that evaluation, and the importance of remaining in the ED until their evaluation is complete.     Phyliss Breen, PA-C 08/10/23 1201

## 2023-08-10 NOTE — ED Provider Notes (Addendum)
 [facility] Provider Note    Event Date/Time   First MD Initiated Contact with Patient 08/10/23 1233     (approximate)   History   Back Pain   HPI  Is a 78 year old male who comes in with concern for left SI, hip pain radiating down the left leg.  Patient denies any urinary symptoms, saddle anesthesia, numbness, weakness.  He able to ambulate with a cane but just reports increasing pain in his left hip.  He states this been ongoing for 4 days.  He denies any abdominal pain, falls.  He states that he has had this pain previously back in October or November and that he saw a Land and it got better.  Physical Exam   Triage Vital Signs: ED Triage Vitals  Encounter Vitals Group     BP 08/10/23 1155 120/72     Systolic BP Percentile --      Diastolic BP Percentile --      Pulse Rate 08/10/23 1155 64     Resp 08/10/23 1155 17     Temp 08/10/23 1155 97.7 F (36.5 C)     Temp Source 08/10/23 1155 Oral     SpO2 08/10/23 1155 96 %     Weight 08/10/23 1158 169 lb (76.7 kg)     Height 08/10/23 1158 5\' 10"  (1.778 m)     Head Circumference --      Peak Flow --      Pain Score 08/10/23 1157 10     Pain Loc --      Pain Education --      Exclude from Growth Chart --     Most recent vital signs: Vitals:   08/10/23 1155  BP: 120/72  Pulse: 64  Resp: 17  Temp: 97.7 F (36.5 C)  SpO2: 96%     General: Awake, no distress.  CV:  Good peripheral perfusion.  Resp:  Normal effort.  Abd:  No distention.  Soft and nontender Other:  Good distal pulses.  No redness, no warmth of the joint.  He reports pain over his SI joint.  Patient is able to ambulate with a cane.  He denies any numbness.  Good strength in bilateral legs.  No foot drop.  No CTL spine tenderness   ED Results / Procedures / Treatments   Labs (all labs ordered are listed, but only abnormal results are displayed) Labs Reviewed - No data to display   RADIOLOGY I have reviewed the  xray personally and interpreted and no obvious fracture noted.  PROCEDURES:  Critical Care performed: No  Procedures   MEDICATIONS ORDERED IN ED: Medications - No data to display   IMPRESSION / MDM / ASSESSMENT AND PLAN / ED COURSE  I reviewed the triage vital signs and the nursing notes.   Patient's presentation is most consistent with acute presentation with potential threat to life or bodily function.   Patient comes in with 4 days of left SI joint pain with pain radiating down the back.  Suspect this is more likely musculoskeletal, pinched nerve.  X-rays were ordered to ensure no signs of any lytic lesions, mass, fracture but this seems less likely based upon examination.  Patient is neurovascularly intact and he has no symptoms to suggest cord compression.  We discussed Tylenol, ibuprofen , lidocaine  patches and oxycodone  for breakthrough pain and following up with orthopedics outpatient if x-rays are reassuring.  He has not had any creatinine test in our system for over  4 years but patient's wife states that his creatinine was checked in December and was normal and they are okay with proceeding with ibuprofen .  Patient is ambulatory and they feel comfortable with discharge home after x-ray results.  Abdomen is soft and nontender low suspicion for a aneurysm and this been ongoing for 4 days with stable vital signs his pain is reproducible on examination and point tenderness over his SI joint  Moderate disc space narrowing at L2-3, L3-4, L4-5 and L5-S1.  Endplate osteophytes more prominent at L3-4 and L5-S1. Lower lumbar  facet osteoarthritis. Findings could certainly relate to back pain  or neural compression.   IMPRESSION: Normal radiographs of the left hip.    We discussed need for imaging  like CT/MRI and family is agreeable for holding off but we did discuss return precautions in regards to weakness, numbness etc.   FINAL CLINICAL IMPRESSION(S) / ED DIAGNOSES   Final  diagnoses:  SI joint arthritis (HCC)  Acute left-sided low back pain with left-sided sciatica     Rx / DC Orders   ED Discharge Orders          Ordered    ibuprofen  (ADVIL ) 600 MG tablet  Every 6 hours PRN        08/10/23 1443    lidocaine  (LIDODERM ) 5 %  Every 12 hours        08/10/23 1443    oxyCODONE  (ROXICODONE ) 5 MG immediate release tablet  Every 8 hours PRN        08/10/23 1443             Note:  This document was prepared using Dragon voice recognition software and may include unintentional dictation errors.   Lubertha Rush, MD 08/10/23 1444    Lubertha Rush, MD 08/10/23 [phone]

## 2023-08-10 NOTE — ED Triage Notes (Signed)
 Pt c/o chronic recurrent left, lower back pain with radiculopathy down L leg. Pt denies urinary sx. Pt denies any saddle anesthesia or incontinence. Pt denies recent injury or exacerbating factors.

## 2023-08-27 ENCOUNTER — Other Ambulatory Visit: Payer: Self-pay | Admitting: Orthopedic Surgery

## 2023-08-27 DIAGNOSIS — M4807 Spinal stenosis, lumbosacral region: Secondary | ICD-10-CM

## 2023-09-02 ENCOUNTER — Ambulatory Visit
Admission: RE | Admit: 2023-09-02 | Discharge: 2023-09-02 | Disposition: A | Discharge status: Home / Self Care | Payer: Medicare Other | Source: Ambulatory Visit | Attending: Orthopedic Surgery | Admitting: Orthopedic Surgery

## 2023-09-02 DIAGNOSIS — M4807 Spinal stenosis, lumbosacral region: Secondary | ICD-10-CM | POA: Insufficient documentation
